# Patient Record
Sex: Female | Born: 1940
Health system: Southern US, Community
[De-identification: ages and names within clinical notes are randomized; demographics above are authoritative.]

## PROBLEM LIST (undated history)

## (undated) ENCOUNTER — Emergency Department (HOSPITAL_COMMUNITY): Admission: EM | Payer: Self-pay | Source: Home / Self Care

## (undated) DIAGNOSIS — J302 Other seasonal allergic rhinitis: Secondary | ICD-10-CM

## (undated) DIAGNOSIS — K635 Polyp of colon: Secondary | ICD-10-CM

## (undated) DIAGNOSIS — J449 Chronic obstructive pulmonary disease, unspecified: Secondary | ICD-10-CM

## (undated) DIAGNOSIS — M1990 Unspecified osteoarthritis, unspecified site: Secondary | ICD-10-CM

## (undated) HISTORY — PX: COLONOSCOPY: SHX174

---

## 2014-02-20 ENCOUNTER — Other Ambulatory Visit (HOSPITAL_COMMUNITY): Payer: Self-pay | Admitting: Internal Medicine

## 2014-02-20 ENCOUNTER — Ambulatory Visit (HOSPITAL_COMMUNITY)
Admission: RE | Admit: 2014-02-20 | Discharge: 2014-02-20 | Disposition: A | Discharge status: Home / Self Care | Payer: Medicare Other | Source: Ambulatory Visit | Attending: Internal Medicine | Admitting: Internal Medicine

## 2014-02-20 DIAGNOSIS — R05 Cough: Secondary | ICD-10-CM

## 2014-02-20 DIAGNOSIS — R059 Cough, unspecified: Secondary | ICD-10-CM

## 2014-02-20 DIAGNOSIS — J40 Bronchitis, not specified as acute or chronic: Secondary | ICD-10-CM | POA: Insufficient documentation

## 2014-02-22 ENCOUNTER — Other Ambulatory Visit (HOSPITAL_COMMUNITY): Payer: Self-pay | Admitting: Internal Medicine

## 2014-02-22 DIAGNOSIS — J40 Bronchitis, not specified as acute or chronic: Secondary | ICD-10-CM

## 2014-02-24 ENCOUNTER — Ambulatory Visit (HOSPITAL_COMMUNITY)
Admission: RE | Admit: 2014-02-24 | Discharge: 2014-02-24 | Disposition: A | Discharge status: Home / Self Care | Payer: Medicare Other | Source: Ambulatory Visit | Attending: Internal Medicine | Admitting: Internal Medicine

## 2014-02-24 ENCOUNTER — Other Ambulatory Visit (HOSPITAL_COMMUNITY): Payer: Self-pay | Admitting: Internal Medicine

## 2014-02-24 DIAGNOSIS — J40 Bronchitis, not specified as acute or chronic: Secondary | ICD-10-CM | POA: Insufficient documentation

## 2014-02-24 DIAGNOSIS — Z1231 Encounter for screening mammogram for malignant neoplasm of breast: Secondary | ICD-10-CM

## 2014-02-24 DIAGNOSIS — R918 Other nonspecific abnormal finding of lung field: Secondary | ICD-10-CM | POA: Insufficient documentation

## 2014-03-01 ENCOUNTER — Ambulatory Visit (HOSPITAL_COMMUNITY)
Admission: RE | Admit: 2014-03-01 | Discharge: 2014-03-01 | Disposition: A | Discharge status: Home / Self Care | Payer: Medicare Other | Source: Ambulatory Visit | Attending: Internal Medicine | Admitting: Internal Medicine

## 2014-03-01 DIAGNOSIS — Z1231 Encounter for screening mammogram for malignant neoplasm of breast: Secondary | ICD-10-CM | POA: Insufficient documentation

## 2014-03-09 ENCOUNTER — Other Ambulatory Visit: Payer: Self-pay

## 2014-03-09 ENCOUNTER — Telehealth: Payer: Self-pay

## 2014-03-09 DIAGNOSIS — Z1211 Encounter for screening for malignant neoplasm of colon: Secondary | ICD-10-CM

## 2014-03-09 NOTE — Telephone Encounter (Signed)
Gastroenterology Pre-Procedure Review  Request Date: 03/09/2014 Requesting Physician: Dr. Legrand Rams  PATIENT REVIEW QUESTIONS: The patient responded to the following health history questions as indicated:    1. Diabetes Melitis: no 2. Joint replacements in the past 12 months: no 3. Major health problems in the past 3 months: no 4. Has an artificial valve or MVP: no 5. Has a defibrillator: no 6. Has been advised in past to take antibiotics in advance of a procedure like teeth cleaning: no    MEDICATIONS & ALLERGIES:    Patient reports the following regarding taking any blood thinners:   Plavix? no Aspirin? no Coumadin? no  Patient confirms/reports the following medications:  Current Outpatient Prescriptions  Medication Sig Dispense Refill  . fluticasone (FLOVENT HFA) 110 MCG/ACT inhaler Inhale into the lungs 2 (two) times daily.      Marland Kitchen loratadine (CLARITIN) 10 MG tablet Take 10 mg by mouth daily.      . NON FORMULARY Pro Air as directed      . predniSONE (DELTASONE) 10 MG tablet Take 10 mg by mouth daily with breakfast. As directed ( different quantity daily)      . tiotropium (SPIRIVA) 18 MCG inhalation capsule Place 18 mcg into inhaler and inhale daily. As directed       No current facility-administered medications for this visit.    Patient confirms/reports the following allergies:  No Known Allergies  No orders of the defined types were placed in this encounter.    AUTHORIZATION INFORMATION Primary Insurance:   ID #:   Group #:  Pre-Cert / Auth required:  Pre-Cert / Auth #:   Secondary Insurance:  ID #:   Group #:  Pre-Cert / Auth required: Pre-Cert / Auth #:   SCHEDULE INFORMATION: Procedure has been scheduled as follows:  Date: 04/07/2014           Time:  8:30 AM Location: [facility] Short Stay  This Gastroenterology Pre-Precedure Review Form is being routed to the following provider(s): Barney Drain, MD

## 2014-03-14 NOTE — Telephone Encounter (Signed)
PREPOPIK-DRINK WATER TO KEEP URINE LIGHT YELLOW.  PT SHOULD DROP OFF RX 3 DAYS PRIOR TO PROCEDURE.  

## 2014-03-20 MED ORDER — SOD PICOSULFATE-MAG OX-CIT ACD 10-3.5-12 MG-GM-GM PO PACK: 1.0000 | PACK | ORAL | Status: DC

## 2014-03-20 NOTE — Telephone Encounter (Signed)
Rx sent to the pharmacy and instructions mailed to pt.  

## 2014-03-22 ENCOUNTER — Telehealth: Payer: Self-pay

## 2014-03-22 MED ORDER — PEG-KCL-NACL-NASULF-NA ASC-C 100 G PO SOLR: 1.0000 | ORAL | Status: DC

## 2014-03-22 NOTE — Telephone Encounter (Signed)
I'm also mailed the pt a copy of new instructions and a note to disregard the previous instructions. Could not reach pt by phone. Not accepting incoming calls.

## 2014-03-22 NOTE — Telephone Encounter (Signed)
Prepopik too expensive. ( $140.00) . I am sending over Movie prep and faxing the instructions for pt to the pharmacy.

## 2014-03-27 ENCOUNTER — Encounter (HOSPITAL_COMMUNITY): Payer: Self-pay | Admitting: Pharmacy Technician

## 2014-04-06 ENCOUNTER — Telehealth: Payer: Self-pay

## 2014-04-06 NOTE — Telephone Encounter (Signed)
Pt brought her prep by to get instructions on doing the prep. I went over all of the instructions and told her how to mix the prep.  She was disappointed that she could not eat today and said she has only had a very small fried green tomato earlier today. She is aware to do clear liquids today and she said her niece was taking her to the hospital tomorrow.  She said she might just reschedule but I talked to her and told her she is this close to checking her colon for colon cancer since this will be her first colonoscopy. She said she will try to go through with it. She is aware to do the enema also one hour before going to the hospital.

## 2014-04-07 ENCOUNTER — Encounter (HOSPITAL_COMMUNITY): Payer: Self-pay | Admitting: *Deleted

## 2014-04-07 ENCOUNTER — Ambulatory Visit (HOSPITAL_COMMUNITY)
Admission: RE | Admit: 2014-04-07 | Discharge: 2014-04-07 | Disposition: A | Discharge status: Home / Self Care | Payer: Medicare Other | Source: Ambulatory Visit | Attending: Gastroenterology | Admitting: Gastroenterology

## 2014-04-07 ENCOUNTER — Encounter (HOSPITAL_COMMUNITY)
Admission: RE | Disposition: A | Discharge status: Home / Self Care | Payer: Self-pay | Source: Ambulatory Visit | Attending: Gastroenterology

## 2014-04-07 DIAGNOSIS — J449 Chronic obstructive pulmonary disease, unspecified: Secondary | ICD-10-CM | POA: Insufficient documentation

## 2014-04-07 DIAGNOSIS — K620 Anal polyp: Secondary | ICD-10-CM | POA: Diagnosis not present

## 2014-04-07 DIAGNOSIS — Q438 Other specified congenital malformations of intestine: Secondary | ICD-10-CM | POA: Diagnosis not present

## 2014-04-07 DIAGNOSIS — F17200 Nicotine dependence, unspecified, uncomplicated: Secondary | ICD-10-CM | POA: Insufficient documentation

## 2014-04-07 DIAGNOSIS — K621 Rectal polyp: Secondary | ICD-10-CM | POA: Diagnosis not present

## 2014-04-07 DIAGNOSIS — IMO0002 Reserved for concepts with insufficient information to code with codable children: Secondary | ICD-10-CM | POA: Diagnosis not present

## 2014-04-07 DIAGNOSIS — D129 Benign neoplasm of anus and anal canal: Secondary | ICD-10-CM

## 2014-04-07 DIAGNOSIS — J4489 Other specified chronic obstructive pulmonary disease: Secondary | ICD-10-CM | POA: Insufficient documentation

## 2014-04-07 DIAGNOSIS — D126 Benign neoplasm of colon, unspecified: Secondary | ICD-10-CM | POA: Diagnosis not present

## 2014-04-07 DIAGNOSIS — Z1211 Encounter for screening for malignant neoplasm of colon: Secondary | ICD-10-CM | POA: Insufficient documentation

## 2014-04-07 DIAGNOSIS — D128 Benign neoplasm of rectum: Secondary | ICD-10-CM

## 2014-04-07 HISTORY — DX: Chronic obstructive pulmonary disease, unspecified: J44.9

## 2014-04-07 HISTORY — PX: COLONOSCOPY: SHX5424

## 2014-04-07 HISTORY — DX: Other seasonal allergic rhinitis: J30.2

## 2014-04-07 SURGERY — COLONOSCOPY
Anesthesia: Moderate Sedation

## 2014-04-07 MED ORDER — MIDAZOLAM HCL 5 MG/5ML IJ SOLN
INTRAMUSCULAR | Status: DC | PRN
  Administered 2014-04-07 (×2): 1 mg via INTRAVENOUS
  Administered 2014-04-07: 2 mg via INTRAVENOUS
  Administered 2014-04-07: 1 mg via INTRAVENOUS

## 2014-04-07 MED ORDER — MEPERIDINE HCL 100 MG/ML IJ SOLN
INTRAMUSCULAR | Status: DC | PRN
  Administered 2014-04-07: 25 mg via INTRAVENOUS

## 2014-04-07 MED ORDER — MEPERIDINE HCL 100 MG/ML IJ SOLN
INTRAMUSCULAR | Status: AC
  Filled 2014-04-07: qty 2

## 2014-04-07 MED ORDER — STERILE WATER FOR IRRIGATION IR SOLN
Status: DC | PRN
  Administered 2014-04-07: 08:00:00

## 2014-04-07 MED ORDER — SODIUM CHLORIDE 0.9 % IV SOLN
INTRAVENOUS | Status: DC
  Administered 2014-04-07: 08:00:00 via INTRAVENOUS

## 2014-04-07 MED ORDER — MIDAZOLAM HCL 5 MG/5ML IJ SOLN
INTRAMUSCULAR | Status: AC
  Filled 2014-04-07: qty 10

## 2014-04-07 NOTE — Telephone Encounter (Signed)
REVIEWED.  

## 2014-04-07 NOTE — H&P (Signed)
  Primary Care Physician:  Rosita Fire, MD Primary Gastroenterologist:  Dr. Oneida Alar  Pre-Procedure History & Physical: HPI:  Emily Nichols is a 73 y.o. female here for [place].  Past Medical History  Diagnosis Date  . Seasonal allergies   . COPD (chronic obstructive pulmonary disease)     Past Surgical History  Procedure Laterality Date  . No past surgeries      Prior to Admission medications   Medication Sig Start Date End Date Taking? Authorizing Provider  albuterol (PROVENTIL HFA;VENTOLIN HFA) 108 (90 BASE) MCG/ACT inhaler Inhale 1 puff into the lungs every 6 (six) hours as needed for wheezing or shortness of breath.   Yes Historical Provider, MD  fluticasone (FLONASE) 50 MCG/ACT nasal spray Place 1 spray into both nostrils daily.   Yes Historical Provider, MD  loratadine (CLARITIN) 10 MG tablet Take 10 mg by mouth daily.   Yes Historical Provider, MD  peg 3350 powder (MOVIPREP) 100 G SOLR Take 1 kit (200 g total) by mouth as directed. 03/22/14  Yes Danie Binder, MD  tiotropium (SPIRIVA) 18 MCG inhalation capsule Place 18 mcg into inhaler and inhale daily. As directed   Yes Historical Provider, MD  Sod Picosulfate-Mag Ox-Cit Acd 10-3.5-12 MG-GM-GM PACK Take 1 Container by mouth as directed. 03/20/14   Danie Binder, MD    Allergies as of 03/09/2014  . (No Known Allergies)    Family History  Problem Relation Age of Onset  . Colon cancer Neg Hx     History   Social History  . Marital Status: Single    Spouse Name: N/A    Number of Children: N/A  . Years of Education: N/A   Occupational History  . Not on file.   Social History Main Topics  . Smoking status: Current Some Day Smoker -- 10 years    Types: Cigarettes  . Smokeless tobacco: Not on file     Comment: "Sometimes I just light up one and smoke it."  . Alcohol Use: No  . Drug Use: No  . Sexual Activity: Not on file   Other Topics Concern  . Not on file   Social History Narrative  . No  narrative on file    Review of Systems: See HPI, otherwise negative ROS   Physical Exam: There were no vitals taken for this visit. General:   Alert,  pleasant and cooperative in NAD Head:  Normocephalic and atraumatic. Neck:  Supple; Lungs:  Clear throughout to auscultation.    Heart:  Regular rate and rhythm. Abdomen:  Soft, nontender and nondistended. Normal bowel sounds, without guarding, and without rebound.   Neurologic:  Alert and  oriented x4;  grossly normal neurologically.  Impression/Plan:     SCREENING  Plan:  1. TCS TODAY

## 2014-04-07 NOTE — Op Note (Signed)
[facility] [address] [place], [address]   COLONOSCOPY PROCEDURE REPORT  PATIENT: Emily Nichols, Emily Nichols  MR#: [id number] BIRTHDATE: 1941-02-24 , 72  yrs. old GENDER: Female ENDOSCOPIST: Barney Drain, MD REFERRED OI:[id number] Fanta, M.D. PROCEDURE DATE:  04/07/2014 PROCEDURE:   Colonoscopy with snare polypectomy and Colonoscopy with cold biopsy polypectomy INDICATIONS:Average risk patient for colon cancer. MEDICATIONS: Demerol 50 mg IV and Versed 5 mg IV  DESCRIPTION OF PROCEDURE:    Physical exam was performed.  Informed consent was obtained from the patient after explaining the benefits, risks, and alternatives to procedure.  The patient was connected to monitor and placed in left lateral position. Continuous oxygen was provided by nasal cannula and IV medicine administered through an indwelling cannula.  After administration of sedation and rectal exam, the patients rectum was intubated and the EC-3890Li ([id number])  colonoscope was advanced under direct visualization to the cecum.  The scope was removed slowly by carefully examining the color, texture, anatomy, and integrity mucosa on the way out.  The patient was recovered in endoscopy and discharged home in satisfactory condition.     COLON FINDINGS: SEVEN sessile polyps measuring 6-8 mm in size were found in the descending colon, sigmoid colon, and rectum.  A polypectomy was performed using snare cautery.  , Seven sessile polyps measuring 3-5 mm in size were found in the sigmoid colon and rectum.  A polypectomy was performed with cold forceps.  The LEFT colon IS redundant.  Manual abdominal counter-pressure was used to reach the cecum. NO INTERNAL HEMORRHOIDS.  PREP QUALITY: good.  CECAL W/D TIME: 30 minutes     COMPLICATIONS: None  ENDOSCOPIC IMPRESSION: 1.   14 COLON POLYPS REMOVED 3.   The LEFT COLON IS was redundant  RECOMMENDATIONS: FOLLOW A HIGH FIBER DIET.  AVOID ITEMS THAT CAUSE  BLOATING. BIOPSY RESULTS SHOULD BE BACK IN 14 DAYS. Next colonoscopy in 5-10 years.       _______________________________ Lorrin MaisBarney Drain, MD 04/07/2014 4:05 PM

## 2014-04-07 NOTE — Discharge Instructions (Signed)
You had 14 polyps removed.    FOLLOW A HIGH FIBER DIET. AVOID ITEMS THAT CAUSE BLOATING. SEE INFO BELOW.  YOUR BIOPSY RESULTS SHOULD BE BACK IN 14 DAYS.  Next colonoscopy in 5-10 years.  Colonoscopy Care After Read the instructions outlined below and refer to this sheet in the next week. These discharge instructions provide you with general information on caring for yourself after you leave the hospital. While your treatment has been planned according to the most current medical practices available, unavoidable complications occasionally occur. If you have any problems or questions after discharge, call DR. Blanca Carreon, [phone].  ACTIVITY  You may resume your regular activity, but move at a slower pace for the next 24 hours.   Take frequent rest periods for the next 24 hours.   Walking will help get rid of the air and reduce the bloated feeling in your belly (abdomen).   No driving for 24 hours (because of the medicine (anesthesia) used during the test).   You may shower.   Do not sign any important legal documents or operate any machinery for 24 hours (because of the anesthesia used during the test).    NUTRITION  Drink plenty of fluids.   You may resume your normal diet as instructed by your doctor.   Begin with a light meal and progress to your normal diet. Heavy or fried foods are harder to digest and may make you feel sick to your stomach (nauseated).   Avoid alcoholic beverages for 24 hours or as instructed.    MEDICATIONS  You may resume your normal medications.   WHAT YOU CAN EXPECT TODAY  Some feelings of bloating in the abdomen.   Passage of more gas than usual.   Spotting of blood in your stool or on the toilet paper  .  IF YOU HAD POLYPS REMOVED DURING THE COLONOSCOPY:  Eat a soft diet IF YOU HAVE NAUSEA, BLOATING, ABDOMINAL PAIN, OR VOMITING.    FINDING OUT THE RESULTS OF YOUR TEST Not all test results are available during your visit. DR. Oneida Alar  WILL CALL YOU WITHIN 7 DAYS OF YOUR PROCEDUE WITH YOUR RESULTS. Do not assume everything is normal if you have not heard from DR. Dimonique Bourdeau IN ONE WEEK, CALL HER OFFICE AT [phone].  SEEK IMMEDIATE MEDICAL ATTENTION AND CALL THE OFFICE: [phone] IF:  You have more than a spotting of blood in your stool.   Your belly is swollen (abdominal distention).   You are nauseated or vomiting.   You have a temperature over 101F.   You have abdominal pain or discomfort that is severe or gets worse throughout the day.   High-Fiber Diet A high-fiber diet changes your normal diet to include more whole grains, legumes, fruits, and vegetables. Changes in the diet involve replacing refined carbohydrates with unrefined foods. The calorie level of the diet is essentially unchanged. The Dietary Reference Intake (recommended amount) for adult males is 38 grams per day. For adult females, it is 25 grams per day. Pregnant and lactating women should consume 28 grams of fiber per day. Fiber is the intact part of a plant that is not broken down during digestion. Functional fiber is fiber that has been isolated from the plant to provide a beneficial effect in the body. PURPOSE  Increase stool bulk.   Ease and regulate bowel movements.   Lower cholesterol.  INDICATIONS THAT YOU NEED MORE FIBER  Constipation and hemorrhoids.   Uncomplicated diverticulosis (intestine condition) and irritable bowel syndrome.  Weight management.   As a protective measure against hardening of the arteries (atherosclerosis), diabetes, and cancer.   GUIDELINES FOR INCREASING FIBER IN THE DIET  Start adding fiber to the diet slowly. A gradual increase of about 5 more grams (2 slices of whole-wheat bread, 2 servings of most fruits or vegetables, or 1 bowl of high-fiber cereal) per day is best. Too rapid an increase in fiber may result in constipation, flatulence, and bloating.   Drink enough water and fluids to keep your  urine clear or pale yellow. Water, juice, or caffeine-free drinks are recommended. Not drinking enough fluid may cause constipation.   Eat a variety of high-fiber foods rather than one type of fiber.   Try to increase your intake of fiber through using high-fiber foods rather than fiber pills or supplements that contain small amounts of fiber.   The goal is to change the types of food eaten. Do not supplement your present diet with high-fiber foods, but replace foods in your present diet.  INCLUDE A VARIETY OF FIBER SOURCES  Replace refined and processed grains with whole grains, canned fruits with fresh fruits, and incorporate other fiber sources. White rice, white breads, and most bakery goods contain little or no fiber.   Brown whole-grain rice, buckwheat oats, and many fruits and vegetables are all good sources of fiber. These include: broccoli, Brussels sprouts, cabbage, cauliflower, beets, sweet potatoes, white potatoes (skin on), carrots, tomatoes, eggplant, squash, berries, fresh fruits, and dried fruits.   Cereals appear to be the richest source of fiber. Cereal fiber is found in whole grains and bran. Bran is the fiber-rich outer coat of cereal grain, which is largely removed in refining. In whole-grain cereals, the bran remains. In breakfast cereals, the largest amount of fiber is found in those with "bran" in their names. The fiber content is sometimes indicated on the label.   You may need to include additional fruits and vegetables each day.   In baking, for 1 cup white flour, you may use the following substitutions:   1 cup whole-wheat flour minus 2 tablespoons.   1/2 cup white flour plus 1/2 cup whole-wheat flour.   Polyps, Colon  A polyp is extra tissue that grows inside your body. Colon polyps grow in the large intestine. The large intestine, also called the colon, is part of your digestive system. It is a long, hollow tube at the end of your digestive tract where your body  makes and stores stool. Most polyps are not dangerous. They are benign. This means they are not cancerous. But over time, some types of polyps can turn into cancer. Polyps that are smaller than a pea are usually not harmful. But larger polyps could someday become or may already be cancerous. To be safe, doctors remove all polyps and test them.   WHO GETS POLYPS? Anyone can get polyps, but certain people are more likely than others. You may have a greater chance of getting polyps if:  You are over 50.   You have had polyps before.   Someone in your family has had polyps.   Someone in your family has had cancer of the large intestine.   Find out if someone in your family has had polyps. You may also be more likely to get polyps if you:   Eat a lot of fatty foods   Smoke   Drink alcohol   Do not exercise  Eat too much   TREATMENT  The caregiver will remove the  polyp during sigmoidoscopy or colonoscopy.    PREVENTION There is not one sure way to prevent polyps. You might be able to lower your risk of getting them if you:  Eat more fruits and vegetables and less fatty food.   Do not smoke.   Avoid alcohol.   Exercise every day.   Lose weight if you are overweight.   Eating more calcium and folate can also lower your risk of getting polyps. Some foods that are rich in calcium are milk, cheese, and broccoli. Some foods that are rich in folate are chickpeas, kidney beans, and spinach.

## 2014-04-11 ENCOUNTER — Encounter (HOSPITAL_COMMUNITY): Payer: Self-pay | Admitting: Gastroenterology

## 2014-04-19 ENCOUNTER — Telehealth: Payer: Self-pay | Admitting: Gastroenterology

## 2014-04-19 NOTE — Telephone Encounter (Signed)
Please call pt. She had simple/SERRATED adenomas removed from her colon.    FOLLOW A HIGH FIBER DIET. AVOID ITEMS THAT CAUSE BLOATING.  Next colonoscopy in 3 years. ALL SISTERS, BROTHERS, CHILDREN, AND PARENTS NEED TO HAVE A COLONOSCOPY STARTING AT THE AGE OF 40.

## 2014-04-20 NOTE — Telephone Encounter (Signed)
Pt is aware of results. 

## 2014-04-20 NOTE — Telephone Encounter (Signed)
Reminder in epic °

## 2014-07-20 ENCOUNTER — Ambulatory Visit (INDEPENDENT_AMBULATORY_CARE_PROVIDER_SITE_OTHER): Payer: Medicare Other | Admitting: Otolaryngology

## 2014-07-20 DIAGNOSIS — J310 Chronic rhinitis: Secondary | ICD-10-CM

## 2014-07-20 DIAGNOSIS — J330 Polyp of nasal cavity: Secondary | ICD-10-CM

## 2014-07-21 ENCOUNTER — Other Ambulatory Visit (INDEPENDENT_AMBULATORY_CARE_PROVIDER_SITE_OTHER): Payer: Self-pay | Admitting: Otolaryngology

## 2014-07-21 DIAGNOSIS — J320 Chronic maxillary sinusitis: Secondary | ICD-10-CM

## 2014-07-21 DIAGNOSIS — J339 Nasal polyp, unspecified: Secondary | ICD-10-CM

## 2014-07-27 ENCOUNTER — Ambulatory Visit (HOSPITAL_COMMUNITY)
Admission: RE | Admit: 2014-07-27 | Discharge: 2014-07-27 | Disposition: A | Discharge status: Home / Self Care | Payer: Medicare Other | Source: Ambulatory Visit | Attending: Otolaryngology | Admitting: Otolaryngology

## 2014-07-27 DIAGNOSIS — J320 Chronic maxillary sinusitis: Secondary | ICD-10-CM | POA: Insufficient documentation

## 2014-07-27 DIAGNOSIS — J339 Nasal polyp, unspecified: Secondary | ICD-10-CM | POA: Diagnosis not present

## 2014-08-17 ENCOUNTER — Ambulatory Visit (INDEPENDENT_AMBULATORY_CARE_PROVIDER_SITE_OTHER): Payer: Medicare Other | Admitting: Otolaryngology

## 2014-08-17 DIAGNOSIS — J321 Chronic frontal sinusitis: Secondary | ICD-10-CM

## 2014-08-17 DIAGNOSIS — J322 Chronic ethmoidal sinusitis: Secondary | ICD-10-CM

## 2014-08-17 DIAGNOSIS — J320 Chronic maxillary sinusitis: Secondary | ICD-10-CM

## 2014-08-17 DIAGNOSIS — J330 Polyp of nasal cavity: Secondary | ICD-10-CM

## 2014-09-21 DIAGNOSIS — M549 Dorsalgia, unspecified: Secondary | ICD-10-CM | POA: Diagnosis not present

## 2014-12-21 DIAGNOSIS — J45998 Other asthma: Secondary | ICD-10-CM | POA: Diagnosis not present

## 2014-12-21 DIAGNOSIS — J309 Allergic rhinitis, unspecified: Secondary | ICD-10-CM | POA: Diagnosis not present

## 2015-03-22 DIAGNOSIS — J309 Allergic rhinitis, unspecified: Secondary | ICD-10-CM | POA: Diagnosis not present

## 2015-03-22 DIAGNOSIS — J329 Chronic sinusitis, unspecified: Secondary | ICD-10-CM | POA: Diagnosis not present

## 2015-03-22 DIAGNOSIS — E780 Pure hypercholesterolemia: Secondary | ICD-10-CM | POA: Diagnosis not present

## 2015-04-16 ENCOUNTER — Other Ambulatory Visit (HOSPITAL_COMMUNITY): Payer: Self-pay | Admitting: Internal Medicine

## 2015-04-16 DIAGNOSIS — Z1231 Encounter for screening mammogram for malignant neoplasm of breast: Secondary | ICD-10-CM

## 2015-04-25 ENCOUNTER — Ambulatory Visit (HOSPITAL_COMMUNITY)
Admission: RE | Admit: 2015-04-25 | Discharge: 2015-04-25 | Disposition: A | Discharge status: Home / Self Care | Payer: Medicare Other | Source: Ambulatory Visit | Attending: Internal Medicine | Admitting: Internal Medicine

## 2015-04-25 DIAGNOSIS — Z1231 Encounter for screening mammogram for malignant neoplasm of breast: Secondary | ICD-10-CM | POA: Diagnosis present

## 2015-06-28 DIAGNOSIS — J329 Chronic sinusitis, unspecified: Secondary | ICD-10-CM | POA: Diagnosis not present

## 2015-06-28 DIAGNOSIS — J309 Allergic rhinitis, unspecified: Secondary | ICD-10-CM | POA: Diagnosis not present

## 2015-09-10 ENCOUNTER — Other Ambulatory Visit (HOSPITAL_COMMUNITY): Payer: Self-pay | Admitting: Internal Medicine

## 2015-09-10 ENCOUNTER — Ambulatory Visit (HOSPITAL_COMMUNITY)
Admission: RE | Admit: 2015-09-10 | Discharge: 2015-09-10 | Disposition: A | Discharge status: Home / Self Care | Payer: Medicare Other | Source: Ambulatory Visit | Attending: Internal Medicine | Admitting: Internal Medicine

## 2015-09-10 DIAGNOSIS — M25561 Pain in right knee: Secondary | ICD-10-CM | POA: Diagnosis present

## 2015-09-10 DIAGNOSIS — M25461 Effusion, right knee: Secondary | ICD-10-CM | POA: Insufficient documentation

## 2015-09-10 DIAGNOSIS — J309 Allergic rhinitis, unspecified: Secondary | ICD-10-CM | POA: Diagnosis not present

## 2015-09-10 DIAGNOSIS — J329 Chronic sinusitis, unspecified: Secondary | ICD-10-CM | POA: Diagnosis not present

## 2015-10-12 ENCOUNTER — Other Ambulatory Visit (HOSPITAL_COMMUNITY): Payer: Self-pay | Admitting: Internal Medicine

## 2015-10-12 ENCOUNTER — Ambulatory Visit (HOSPITAL_COMMUNITY)
Admission: RE | Admit: 2015-10-12 | Discharge: 2015-10-12 | Disposition: A | Discharge status: Home / Self Care | Payer: Medicare Other | Source: Ambulatory Visit | Attending: Internal Medicine | Admitting: Internal Medicine

## 2015-10-12 DIAGNOSIS — M19012 Primary osteoarthritis, left shoulder: Secondary | ICD-10-CM | POA: Insufficient documentation

## 2015-10-12 DIAGNOSIS — M85812 Other specified disorders of bone density and structure, left shoulder: Secondary | ICD-10-CM | POA: Diagnosis not present

## 2015-10-12 DIAGNOSIS — M25512 Pain in left shoulder: Secondary | ICD-10-CM

## 2015-10-12 DIAGNOSIS — J45998 Other asthma: Secondary | ICD-10-CM | POA: Diagnosis not present

## 2015-11-13 ENCOUNTER — Other Ambulatory Visit (HOSPITAL_COMMUNITY): Payer: Self-pay | Admitting: Internal Medicine

## 2015-11-13 DIAGNOSIS — Z780 Asymptomatic menopausal state: Secondary | ICD-10-CM

## 2015-11-13 DIAGNOSIS — M19012 Primary osteoarthritis, left shoulder: Secondary | ICD-10-CM | POA: Diagnosis not present

## 2015-11-13 DIAGNOSIS — M25512 Pain in left shoulder: Secondary | ICD-10-CM | POA: Diagnosis not present

## 2015-11-21 ENCOUNTER — Ambulatory Visit (INDEPENDENT_AMBULATORY_CARE_PROVIDER_SITE_OTHER): Payer: Medicare Other | Admitting: Orthopaedic Surgery

## 2015-11-21 VITALS — BP 110/62 | HR 74 | Temp 97.5°F | Ht 64.0 in | Wt 146.8 lb

## 2015-11-21 DIAGNOSIS — M25511 Pain in right shoulder: Secondary | ICD-10-CM

## 2015-11-21 DIAGNOSIS — M25512 Pain in left shoulder: Secondary | ICD-10-CM | POA: Diagnosis not present

## 2015-11-21 MED ORDER — ACETAMINOPHEN-CODEINE #3 300-30 MG PO TABS: ORAL_TABLET | ORAL | Status: DC

## 2015-11-21 NOTE — Progress Notes (Signed)
Subjective: My shoulders hurt    Patient ID: Emily Nichols, female    DOB: 1940-09-12, 75 y.o.   MRN: [id number]  Shoulder Pain  The pain is present in the left shoulder and right shoulder. This is a recurrent problem. The current episode started more than 1 month ago. There has been no history of extremity trauma. The problem occurs daily. The problem has been gradually worsening. The quality of the pain is described as aching and burning. The pain is at a severity of 4/10. The pain is moderate. The symptoms are aggravated by activity and cold. She has tried acetaminophen, heat, NSAIDS, oral narcotics and rest for the symptoms. The treatment provided mild relief.   She has had pain in the shoulders, more on the left for about 3 months.  It is not a result of trauma.  There is no redness or paresthesias.  She has had x-rays and has been on Tramadol with little help.  She has tried ice and heat with little help.  I have reviewed the x-rays and the x-ray report.  She has seen Dr. Legrand Rams and is referred here.  Review of Systems  Constitutional:       She does smoke.  HENT: Negative for congestion.   Respiratory: Positive for shortness of breath. Negative for cough.   Cardiovascular: Negative for chest pain and leg swelling.  Endocrine: Positive for cold intolerance.  Musculoskeletal: Positive for myalgias, joint swelling and arthralgias.  Allergic/Immunologic: Positive for environmental allergies.   Past Medical History  Diagnosis Date  . Seasonal allergies   . COPD (chronic obstructive pulmonary disease)    Past Surgical History  Procedure Laterality Date  . No past surgeries    . Colonoscopy N/A 04/07/2014    Procedure: COLONOSCOPY;  Surgeon: Danie Binder, MD;  Location: [facility];  Service: Endoscopy;  Laterality: N/A;  8:30 AM   Social History   Social History  . Marital Status: Single    Spouse Name: N/A  . Number of Children: N/A  . Years of Education: N/A    Occupational History  . Not on file.   Social History Main Topics  . Smoking status: Current Some Day Smoker -- 10 years    Types: Cigarettes  . Smokeless tobacco: Not on file     Comment: "Sometimes I just light up one and smoke it."  . Alcohol Use: No  . Drug Use: No  . Sexual Activity: Not on file   Other Topics Concern  . Not on file   Social History Narrative  . No narrative on file    BP 110/62 mmHg  Pulse 74  Temp(Src) 97.5 F (36.4 C)  Ht 5\' 4"  (1.626 m)  Wt 146 lb 12.8 oz (66.588 kg)  BMI 25.19 kg/m2     Objective:   Physical Exam  Constitutional: She is oriented to person, place, and time. She appears well-developed and well-nourished.  HENT:  Head: Normocephalic and atraumatic.  Eyes: Conjunctivae and EOM are normal. Pupils are equal, round, and reactive to light.  Neck: Normal range of motion.  Cardiovascular: Normal rate, regular rhythm and intact distal pulses.   Pulmonary/Chest: Effort normal.  Abdominal: Soft.  Musculoskeletal: She exhibits tenderness (Both shoujlders are tender, the left greater than the right.  Motion is decreased.  No redness is present.  No paresthesia is present.).       Arms: Neurological: She is alert and oriented to person, place, and time. She has normal  reflexes. She displays normal reflexes. No cranial nerve deficit. She exhibits normal muscle tone. Coordination normal.  Skin: Skin is warm and dry.  Psychiatric: She has a normal mood and affect. Her behavior is normal. Judgment and thought content normal.  Vitals reviewed.  Examination of left Upper Extremity is done.  Inspection:   Overall:  Elbow non-tender without crepitus or defects, forearm non-tender without crepitus or defects, wrist non-tender without crepitus or defects, hand non-tender.    Shoulder: with glenohumeral joint tenderness, without effusion.   Upper arm: without swelling and tenderness   Range of motion:   Overall:  Full range of motion of the  elbow, full range of motion of wrist and full range of motion in fingers.   Shoulder:  left  60 degrees forward flexion; 45 degrees abduction; 20 degrees internal rotation, 20 degrees external rotation, 10 degrees extension, 30 degrees adduction.   Stability:   Overall:  Shoulder, elbow and wrist stable   Strength and Tone:   Overall full shoulder muscles strength, full upper arm strength and normal upper arm bulk and tone.  PROCEDURE NOTE:  The patient request injection, verbal consent was obtained.  The left shoulder was prepped appropriately after time out was performed.   Sterile technique was observed and injection of 1 cc of Depo-Medrol 40 mg with several cc's of plain xylocaine. Anesthesia was provided by ethyl chloride and a 20-gauge needle was used to inject the shoulder area. A posterior approach was used.  The injection was tolerated well.  A band aid dressing was applied.  The patient was advised to apply ice later today and tomorrow to the injection sight as needed.     Assessment & Plan:  I will set up physical therapy for her.  I have changed her medicine.  Call if any problem.  Return in two weeks.

## 2015-11-22 ENCOUNTER — Ambulatory Visit (HOSPITAL_COMMUNITY)
Admission: RE | Admit: 2015-11-22 | Discharge: 2015-11-22 | Disposition: A | Discharge status: Home / Self Care | Payer: Medicare Other | Source: Ambulatory Visit | Attending: Internal Medicine | Admitting: Internal Medicine

## 2015-11-22 DIAGNOSIS — M85852 Other specified disorders of bone density and structure, left thigh: Secondary | ICD-10-CM | POA: Diagnosis not present

## 2015-11-22 DIAGNOSIS — M8588 Other specified disorders of bone density and structure, other site: Secondary | ICD-10-CM | POA: Insufficient documentation

## 2015-11-22 DIAGNOSIS — M1990 Unspecified osteoarthritis, unspecified site: Secondary | ICD-10-CM | POA: Diagnosis present

## 2015-11-22 DIAGNOSIS — M8589 Other specified disorders of bone density and structure, multiple sites: Secondary | ICD-10-CM | POA: Diagnosis not present

## 2015-11-22 DIAGNOSIS — Z780 Asymptomatic menopausal state: Secondary | ICD-10-CM

## 2015-11-28 ENCOUNTER — Ambulatory Visit (HOSPITAL_COMMUNITY): Payer: Medicare Other | Attending: Orthopaedic Surgery | Admitting: Specialist

## 2015-11-28 DIAGNOSIS — R29898 Other symptoms and signs involving the musculoskeletal system: Secondary | ICD-10-CM | POA: Diagnosis present

## 2015-11-28 DIAGNOSIS — M25512 Pain in left shoulder: Secondary | ICD-10-CM | POA: Insufficient documentation

## 2015-11-28 DIAGNOSIS — M25611 Stiffness of right shoulder, not elsewhere classified: Secondary | ICD-10-CM | POA: Insufficient documentation

## 2015-11-28 DIAGNOSIS — M25511 Pain in right shoulder: Secondary | ICD-10-CM | POA: Insufficient documentation

## 2015-11-28 DIAGNOSIS — M25612 Stiffness of left shoulder, not elsewhere classified: Secondary | ICD-10-CM | POA: Diagnosis present

## 2015-11-28 NOTE — Patient Instructions (Signed)
COMPLETE EACH EXERCISE FOR 1-3 MINUTES, 2-3 TIMES PER DAY.  SHOULDER: Flexion On Table   Place hands on table, elbows straight.slide towel across the table, leaning forward Hold __10_ seconds. Abduction (Passive)   With arm out to side, resting on table with palm down, stretch arm across table bending at your waist.. Hold __10__ seconds. Repeat ____ times. Do ____ sessions per day.  Copyright  VHI. All rights reserved.     Internal Rotation (Assistive)   Seated with elbow bent at right angle and held against side, slide arm on table surface in an inward arc. Repeat ____ times. Do ____ sessions per day. Activity: Use this motion to brush crumbs off the table.  Copyright  VHI. All rights reserved.

## 2015-11-28 NOTE — Therapy (Signed)
[place], [place], [address] Phone: [phone]   Fax:  [phone]  Occupational Therapy Evaluation  Patient Details  Name: Emily Nichols MRN: [id number] Date of Birth: [birth year] Referring Provider: Dr. Sanjuana Kava  Encounter Date: 11/28/2015      OT End of Session - 11/28/15 2134    Visit Number 1   Number of Visits 16   Date for OT Re-Evaluation 01/27/16  mini reassess on 12/28/15   Authorization Type UHC Medicare and Medicaid   Authorization Time Period before 10th visit   Authorization - Visit Number 1   Authorization - Number of Visits 10   OT Start Time 1510   OT Stop Time 1555   OT Time Calculation (min) 45 min   Activity Tolerance Patient tolerated treatment well   Behavior During Therapy [facility] for tasks assessed/performed      Past Medical History  Diagnosis Date  . Seasonal allergies   . COPD (chronic obstructive pulmonary disease)     Past Surgical History  Procedure Laterality Date  . No past surgeries    . Colonoscopy N/A 04/07/2014    Procedure: COLONOSCOPY;  Surgeon: Danie Binder, MD;  [facility];  Service: Endoscopy;  Laterality: N/A;  8:30 AM    There were no vitals filed for this visit.      Subjective Assessment - 11/28/15 2122    Subjective  S:  I have arthritis, its some better and I still have pain   Pertinent History Emily Nichols has been experiencing arthritis pain for several years in both shoulders.  She consulted with Dr. Luna Glasgow and has been referred to occupational therapy for evaluation and treatment.   Patient Stated Goals I want to get rid of the pain   Currently in Pain? Yes   Pain Score 3    Pain Location Shoulder   Pain Orientation Right;Left   Pain Descriptors / Indicators Aching   Pain Type Chronic pain   Pain Onset More than a month ago   Pain Frequency Constant   Aggravating Factors  weather, use   Pain Relieving Factors rest, weather    Effect of Pain on Daily Activities decreased use of arms with functional activities            [facility] OT Assessment - 11/28/15 0001    Assessment   Diagnosis Bilateral Shoulder Pain   Referring Provider Dr. Sanjuana Kava   Onset Date --  chronic   Prior Therapy n/a   Precautions   Precautions None   Restrictions   Weight Bearing Restrictions No   Balance Screen   Has the patient fallen in the past 6 months No   Has the patient had a decrease in activity level because of a fear of falling?  No   Is the patient reluctant to leave their home because of a fear of falling?  No   Home  Environment   Lives With Family   Prior Function   Level of Independence Independent   Vocation Retired   Leisure shopping   ADL   ADL comments difficult to don shirts and bra, difficult to reach overhead   Written Expression   Dominant Hand Right   Vision - History   Baseline Vision Wears glasses all the time   Cognition   Overall Cognitive Status Within Functional Limits for tasks assessed   Observation/Other Assessments   Focus on Therapeutic Outcomes (FOTO)  68.77/100  Sensation   Light Touch Appears Intact   Coordination   Gross Motor Movements are Fluid and Coordinated Yes   Fine Motor Movements are Fluid and Coordinated Yes   Palpation   Palpation comment moderate restriction in bilateral shoulder and scapular regions   AROM   Overall AROM Comments assessed in seated, external rotation and internal rotation with shoulder adducted,    Right/Left Shoulder Right;Left   Right Shoulder Flexion 90 Degrees   Right Shoulder ABduction 90 Degrees   Right Shoulder Internal Rotation 78 Degrees   Right Shoulder External Rotation 65 Degrees   Left Shoulder Flexion 72 Degrees   Left Shoulder ABduction 76 Degrees   Left Shoulder Internal Rotation 90 Degrees   Left Shoulder External Rotation 65 Degrees   PROM   Overall PROM Comments WFL in supine   Strength   Overall Strength Comments  assessed in seated, MMT caused increased pain in bilateral shoulders    Strength Assessment Site Shoulder   Right/Left Shoulder Right;Left   Right Shoulder Flexion 3+/5   Right Shoulder ABduction 3+/5   Right Shoulder Internal Rotation 3+/5   Right Shoulder External Rotation 3+/5   Left Shoulder Flexion 3+/5   Left Shoulder ABduction 3+/5   Left Shoulder Internal Rotation 3+/5   Left Shoulder External Rotation 3+/5                  OT Treatments/Exercises (OP) - 11/28/15 0001    Exercises   Exercises Shoulder   Shoulder Exercises: Supine   External Rotation Both;PROM;5 reps   Internal Rotation Both;PROM;5 reps   Flexion Both;5 reps;PROM   ABduction Both;PROM;5 reps   Manual Therapy   Manual Therapy Myofascial release   Manual therapy comments manual therapy completed seperately from all other treatment interventions this date   Myofascial Release Myofascial release to bilateral upper arm, scapular, and shoulder regions to decrease pain and restrictions and improve pain free mobility in bilateral shoulders                OT Education - 11/28/15 2125    Education provided Yes   Education Details towel slides for bilateral shoulders    Person(s) Educated Patient   Methods Explanation;Demonstration;Handout   Comprehension Verbal cues required;Returned demonstration;Verbalized understanding          OT Short Term Goals - 11/28/15 2140    OT SHORT TERM GOAL #1   Title Patient will be educated on a HEP for improved A/ROM in bilateral shoulders for improved independence with dressing tasks.   Time 4   Period Weeks   Status New   OT SHORT TERM GOAL #2   Title Patient will improve bilateral shoulder A/ROM by 20 degrees for improved ability to don and doff shirts.    Time 4   Period Weeks   Status New   OT SHORT TERM GOAL #3   Title Patient will improve bilateral shoulder strength to 4-/5 for increased ability to lift pots and pans when cooking.    Time 4    Period Weeks   Status New   OT SHORT TERM GOAL #4   Title Patient will decrease pain in bilateral shoulders to 2/10 with functional tasks.   Time 4   Period Weeks   Status New   OT SHORT TERM GOAL #5   Title Patient will decrease fascial restrictions to min-mod in her shoulders in order to have increased mobility with functional tasks    Time 4   Period Weeks  Status New           OT Long Term Goals - 11/28/15 2144    OT LONG TERM GOAL #1   Title Patient will return to prior level of independence with all B/IADLS and leisure tasks, using right hand as dominant.    Time 8   Period Weeks   Status New   OT LONG TERM GOAL #2   Title Patient will improve bilateral shoulder A/ROM to [facility] for improved independence placing dishes into overhead cabinets.   Time 8   Period Weeks   Status New   OT LONG TERM GOAL #3   Title Patient will improve bilateral shoulder strength to 4/5 for increased ability to carry shopping bags.    Time 8   Period Weeks   Status New   OT LONG TERM GOAL #4   Title Patient will decrease pain in her shoulders to 1/10 or better when completing daily tasks.   Time 8   Period Weeks   Status New   OT LONG TERM GOAL #5   Title Patient will have min fasical and muscle tightness in her shoulders for improved functional mobility needed for daily tasks.    Time 8   Period Weeks               Plan - 11/28/15 2135    Clinical Impression Statement A:  Patient is a 75 year old female with past medical history that includes COPD and arthritis.  Patient has been experiencing increased pain and decreased movement in her shoulders for over a year.  These deficits are causing her to require assistance with donning her shirt and bra from her brother, with whom she lives.  She is not able to complete IADLS, such as cooking and cleaning with as much independence as she would like.    Rehab Potential Good   OT Frequency 2x / week   OT Duration 8 weeks   OT  Treatment/Interventions Self-care/ADL training;Cryotherapy;Ultrasound;Moist Heat;Electrical Stimulation;DME and/or AE instruction;Neuromuscular education;Therapeutic exercise;Manual Therapy;Passive range of motion;Therapeutic exercises;Therapeutic activities;Patient/family education   Plan Skilled OT intervention to improve A/ROM and strength and decrease pain and restrictions in order to return to prior level of function with daily tasks.  Treatment plan:  Myofascial release and manual therapy, P/ROM, AA/ROM, scapular A/ROM, ball stretches, progress as tolerated.    Consulted and Agree with Plan of Care Patient      Patient will benefit from skilled therapeutic intervention in order to improve the following deficits and impairments:  Decreased range of motion, Decreased strength, Increased fascial restricitons, Increased muscle spasms, Impaired flexibility, Pain  Visit Diagnosis: Pain in left shoulder - Plan: Ot plan of care cert/re-cert  Pain in right shoulder - Plan: Ot plan of care cert/re-cert  Other symptoms and signs involving the musculoskeletal system - Plan: Ot plan of care cert/re-cert  Stiffness of left shoulder, not elsewhere classified - Plan: Ot plan of care cert/re-cert  Stiffness of right shoulder, not elsewhere classified - Plan: Ot plan of care cert/re-cert      G-Codes - [id number]    Functional Assessment Tool Used FOTO 68.77, 31% impaired   Functional Limitation Self care   Self Care Current Status [id number]) At least 20 percent but less than 40 percent impaired, limited or restricted   Self Care Goal Status [id number]) At least 1 percent but less than 20 percent impaired, limited or restricted      Problem List There are no active problems to  display for this patient.   Vangie Bicker, OTR/L [phone]  11/28/2015, 9:54 PM  Emily Nichols [address] [place], [place], [address] Phone: [phone]   Fax:   [phone]  Name: Emily Nichols MRN: [id number] Date of Birth: Jan 16, 1941

## 2015-12-05 ENCOUNTER — Encounter: Payer: Self-pay | Admitting: Orthopaedic Surgery

## 2015-12-05 ENCOUNTER — Encounter (HOSPITAL_COMMUNITY): Payer: Self-pay

## 2015-12-05 ENCOUNTER — Ambulatory Visit (HOSPITAL_COMMUNITY): Payer: Medicare Other

## 2015-12-05 ENCOUNTER — Ambulatory Visit (INDEPENDENT_AMBULATORY_CARE_PROVIDER_SITE_OTHER): Payer: Medicare Other | Admitting: Orthopaedic Surgery

## 2015-12-05 VITALS — BP 118/71 | HR 66 | Temp 97.5°F | Resp 16 | Ht 64.0 in | Wt 144.0 lb

## 2015-12-05 DIAGNOSIS — M25511 Pain in right shoulder: Secondary | ICD-10-CM | POA: Diagnosis not present

## 2015-12-05 DIAGNOSIS — M25512 Pain in left shoulder: Secondary | ICD-10-CM | POA: Diagnosis not present

## 2015-12-05 DIAGNOSIS — M25612 Stiffness of left shoulder, not elsewhere classified: Secondary | ICD-10-CM | POA: Diagnosis not present

## 2015-12-05 DIAGNOSIS — M25611 Stiffness of right shoulder, not elsewhere classified: Secondary | ICD-10-CM | POA: Diagnosis not present

## 2015-12-05 DIAGNOSIS — R29898 Other symptoms and signs involving the musculoskeletal system: Secondary | ICD-10-CM | POA: Diagnosis not present

## 2015-12-05 DIAGNOSIS — R634 Abnormal weight loss: Secondary | ICD-10-CM | POA: Diagnosis not present

## 2015-12-05 NOTE — Therapy (Signed)
[place], [place], [address] Phone: [phone]   Fax:  [phone]  Occupational Therapy Treatment  Patient Details  Name: Emily Nichols MRN: [id number] Date of Birth: 1941-01-31 Referring Provider: Dr. Sanjuana Kava  Encounter Date: 12/05/2015      OT End of Session - 12/05/15 1615    Visit Number 2   Number of Visits 16   Date for OT Re-Evaluation 01/27/16  mini reassess on 12/28/15   Authorization Type UHC Medicare and Medicaid   Authorization Time Period before 10th visit   Authorization - Visit Number 2   Authorization - Number of Visits 10   OT Start Time 1440  patient arrived late   OT Stop Time 1520   OT Time Calculation (min) 40 min   Activity Tolerance Patient tolerated treatment well   Behavior During Therapy [facility] for tasks assessed/performed      Past Medical History  Diagnosis Date  . Seasonal allergies   . COPD (chronic obstructive pulmonary disease) [facility])     Past Surgical History  Procedure Laterality Date  . No past surgeries    . Colonoscopy N/A 04/07/2014    Procedure: COLONOSCOPY;  Surgeon: Danie Binder, MD;  [facility];  Service: Endoscopy;  Laterality: N/A;  8:30 AM    There were no vitals filed for this visit.      Subjective Assessment - 12/05/15 1455    Subjective  S: They (shoulders) don't hurt.   Currently in Pain? No/denies            [facility] OT Assessment - 12/05/15 1456    Assessment   Diagnosis Bilateral Shoulder Pain   Precautions   Precautions None                  OT Treatments/Exercises (OP) - 12/05/15 1456    Exercises   Exercises Shoulder   Shoulder Exercises: Supine   Protraction Both;PROM;10 reps   Horizontal ABduction Both;PROM;5 reps   External Rotation Both;PROM;5 reps   Internal Rotation Both;PROM;5 reps   Flexion Both;PROM;5 reps   ABduction Both;PROM;5 reps   Shoulder Exercises: Seated   Elevation AROM;10  reps   Extension AROM;10 reps   Row AROM;10 reps   Manual Therapy   Manual Therapy Myofascial release   Manual therapy comments manual therapy completed seperately from all other treatment interventions this date   Myofascial Release Myofascial release to bilateral upper arm, scapular, and shoulder regions to decrease pain and restrictions and improve pain free mobility in bilateral shoulders                 OT Education - 12/05/15 1620    Education provided Yes   Education Details Pt given evaluation hand out and goals were reviewed. Dicussed frequency of therapy (see clinical assessment statement)   Person(s) Educated Patient   Methods Explanation;Handout   Comprehension Verbalized understanding          OT Short Term Goals - 12/05/15 1615    OT SHORT TERM GOAL #1   Title Patient will be educated on a HEP for improved A/ROM in bilateral shoulders for improved independence with dressing tasks.   Time 4   Period Weeks   Status On-going   OT SHORT TERM GOAL #2   Title Patient will improve bilateral shoulder A/ROM by 20 degrees for improved ability to don and doff shirts.    Time 4   Period Weeks  Status On-going   OT SHORT TERM GOAL #3   Title Patient will improve bilateral shoulder strength to 4-/5 for increased ability to lift pots and pans when cooking.    Time 4   Period Weeks   Status On-going   OT SHORT TERM GOAL #4   Title Patient will decrease pain in bilateral shoulders to 2/10 with functional tasks.   Time 4   Period Weeks   Status On-going   OT SHORT TERM GOAL #5   Title Patient will decrease fascial restrictions to min-mod in her shoulders in order to have increased mobility with functional tasks    Time 4   Period Weeks   Status On-going           OT Long Term Goals - 12/05/15 1615    OT LONG TERM GOAL #1   Title Patient will return to prior level of independence with all B/IADLS and leisure tasks, using right hand as dominant.    Time 8    Period Weeks   Status On-going   OT LONG TERM GOAL #2   Title Patient will improve bilateral shoulder A/ROM to [facility] for improved independence placing dishes into overhead cabinets.   Time 8   Period Weeks   Status On-going   OT LONG TERM GOAL #3   Title Patient will improve bilateral shoulder strength to 4/5 for increased ability to carry shopping bags.    Time 8   Period Weeks   Status On-going   OT LONG TERM GOAL #4   Title Patient will decrease pain in her shoulders to 1/10 or better when completing daily tasks.   Time 8   Period Weeks   Status On-going   OT LONG TERM GOAL #5   Title Patient will have min fasical and muscle tightness in her shoulders for improved functional mobility needed for daily tasks.    Time 8   Period Weeks   Status On-going               Plan - 12/05/15 1616    Clinical Impression Statement A: Patient concerned with copay amount of $40 with 2x a week. t requested to decreased frequency. Therapist also informed patient that she can be billed for the therapy copay instead of paying it at each visit. Appointments were decreased to once a week and patient was educated to make sure she is vigilant with them. Pt verbalized understanding. Initiated myofascial release to bilateral shoulders. Minimal fascial restrictions noted this date. Completed manual stretching and therapy ball exercises. VC for form and technique.   Plan P: Complete Myofascial release if needed. Progress to AA/ROM supine when able. Add wall wash.       Patient will benefit from skilled therapeutic intervention in order to improve the following deficits and impairments:  Decreased range of motion, Decreased strength, Increased fascial restricitons, Increased muscle spasms, Impaired flexibility, Pain  Visit Diagnosis: Pain in left shoulder  Pain in right shoulder  Other symptoms and signs involving the musculoskeletal system  Stiffness of left shoulder, not elsewhere  classified  Stiffness of right shoulder, not elsewhere classified    Problem List There are no active problems to display for this patient.   Marijo Conception OTA student 12/05/2015, 4:22 PM  [place] [address] [place], [place], [address] Phone: [phone]   Fax:  [phone]  Name: Emily Nichols MRN: [id number] Date of Birth: Jul 18, 1941  Ailene Ravel, OTR/L,CBIS  [phone]  This entire session was guided, instructed, and directly supervised by Ailene Ravel, OTR/L, CBIS.

## 2015-12-05 NOTE — Patient Instructions (Signed)
Continue therapy

## 2015-12-05 NOTE — Progress Notes (Signed)
Patient [name] Emily Nichols, female DOB:[birth year], 75 y.o. [id number]  Chief Complaint  Patient presents with  . Follow-up     LEFT SHOUDLER PAIN "BETTER"    HPI  Emily Nichols is a 75 y.o. female who has had bilateral shoulder pain.   She has had more pain in the left shoulder.  She is going to PT/OT and doing very well.  She is much improved.  She has less pain.  She is doing her exercises at home.  She is taking her medicine. She has no new trauma.  HPI  She has a new problem.  She is losing weight.  She has last two pounds since last here.  She has no bowel problems, she has no tarry stools, she has no nausea.  She says she does not feel like eating.  She does not like to cook.  She has no other pains.  I have suggested she try Boost or Ensure with each meal or doing "snack" time.  She is agreeable to do this.  I will check her weight on return.  She is still smoking.  She does not want to quit.  I have asked her to at least cut back. She will consider.  Body mass index is 24.71 kg/(m^2).  Review of Systems  Constitutional:       She does smoke.  HENT: Negative for congestion.   Respiratory: Positive for shortness of breath. Negative for cough.   Cardiovascular: Negative for chest pain and leg swelling.  Endocrine: Positive for cold intolerance.  Musculoskeletal: Positive for myalgias, joint swelling and arthralgias.  Allergic/Immunologic: Positive for environmental allergies.    Past Medical History  Diagnosis Date  . Seasonal allergies   . COPD (chronic obstructive pulmonary disease) [facility])     Past Surgical History  Procedure Laterality Date  . No past surgeries    . Colonoscopy N/A 04/07/2014    Procedure: COLONOSCOPY;  Surgeon: Danie Binder, MD;  [facility];  Service: Endoscopy;  Laterality: N/A;  8:30 AM    Family History  Problem Relation Age of Onset  . Colon cancer Neg Hx     Social History Social History  Substance Use Topics  .  Smoking status: Current Some Day Smoker -- 10 years    Types: Cigarettes  . Smokeless tobacco: None     Comment: "Sometimes I just light up one and smoke it."  . Alcohol Use: No    No Known Allergies  Current Outpatient Prescriptions  Medication Sig Dispense Refill  . acetaminophen-codeine (TYLENOL #3) 300-30 MG tablet One tablet every four hours as needed for pain.  Must last TEN days. 40 tablet 2  . albuterol (PROVENTIL HFA;VENTOLIN HFA) 108 (90 BASE) MCG/ACT inhaler Inhale 1 puff into the lungs every 6 (six) hours as needed for wheezing or shortness of breath. Reported on 11/21/2015    . fluticasone (FLONASE) 50 MCG/ACT nasal spray Place into both nostrils daily.    . Fluticasone-Salmeterol (ADVAIR) 100-50 MCG/DOSE AEPB Inhale 1 puff into the lungs 2 (two) times daily.    Marland Kitchen loratadine (CLARITIN) 10 MG tablet Take 10 mg by mouth daily. Reported on 11/28/2015    . montelukast (SINGULAIR) 10 MG tablet Take 10 mg by mouth at bedtime. Reported on 11/28/2015    . Sod Picosulfate-Mag Ox-Cit Acd 10-3.5-12 MG-GM-GM PACK Take 1 Container by mouth as directed. 1 each 0  . tiotropium (SPIRIVA) 18 MCG inhalation capsule Place 18 mcg into inhaler and inhale daily.  Reported on 11/28/2015     No current facility-administered medications for this visit.     Physical Exam  Blood pressure 118/71, pulse 66, temperature 97.5 F (36.4 C), resp. rate 16, height 5\' 4"  (1.626 m), weight 144 lb (65.318 kg).  Constitutional: overall normal hygiene, normal nutrition, well developed, normal grooming, normal body habitus. Assistive device:none  Musculoskeletal: gait and station Limp none, muscle tone and strength are normal, no tremors or atrophy is present.  .  Neurological: coordination overall normal.  Deep tendon reflex/nerve stretch intact.  Sensation normal.  Cranial nerves II-XII intact.   Skin:   normal overall no scars, lesions, ulcers or rashes. No psoriasis.  Psychiatric: Alert and oriented x 3.   Recent memory intact, remote memory unclear.  Normal mood and affect. Well groomed.  Good eye contact.  Cardiovascular: overall no swelling, no varicosities, no edema bilaterally, normal temperatures of the legs and arms, no clubbing, cyanosis and good capillary refill.  Lymphatic: palpation is normal.  Examination of left Upper Extremity is done.  Inspection:   Overall:  Elbow non-tender without crepitus or defects, forearm non-tender without crepitus or defects, wrist non-tender without crepitus or defects, hand non-tender.    Shoulder: with glenohumeral joint tenderness, without effusion.   Upper arm: without swelling and tenderness   Range of motion:   Overall:  Full range of motion of the elbow, full range of motion of wrist and full range of motion in fingers.   Shoulder:  left  100 degrees forward flexion; 75 degrees abduction; 25 degrees internal rotation, 25 degrees external rotation, 15 degrees extension, 35 degrees adduction.   Stability:   Overall:  Shoulder, elbow and wrist stable   Strength and Tone:   Overall full shoulder muscles strength, full upper arm strength and normal upper arm bulk and tone.  Examination of right Upper Extremity is done.  Inspection:   Overall:  Elbow non-tender without crepitus or defects, forearm non-tender without crepitus or defects, wrist non-tender without crepitus or defects, hand non-tender.    Shoulder: with glenohumeral joint tenderness, without effusion.   Upper arm: without swelling and tenderness   Range of motion:   Overall:  Full range of motion of the elbow, full range of motion of wrist and full range of motion in fingers.   Shoulder:  right  145 degrees forward flexion; 100 degrees abduction; 30 degrees internal rotation, 30 degrees external rotation, 15 degrees extension, 40 degrees adduction.   Stability:   Overall:  Shoulder, elbow and wrist stable   Strength and Tone:   Overall full shoulder muscles strength, full upper  arm strength and normal upper arm bulk and tone.  The patient has been educated about the nature of the problem(s) and counseled on treatment options.  The patient appeared to understand what I have discussed and is in agreement with it.  Encounter Diagnosis  Name Primary?  . Left shoulder pain Yes    PLAN Call if any problems.  Precautions discussed.  Continue current medications.   Return to clinic 1 month   Continue therapy.  Take Boost or Ensure.

## 2015-12-07 ENCOUNTER — Encounter (HOSPITAL_COMMUNITY): Payer: Medicare Other | Admitting: Occupational Therapy

## 2015-12-10 ENCOUNTER — Telehealth (HOSPITAL_COMMUNITY): Payer: Self-pay

## 2015-12-10 ENCOUNTER — Ambulatory Visit (HOSPITAL_COMMUNITY): Payer: Medicare Other

## 2015-12-10 NOTE — Telephone Encounter (Signed)
Called patient and left message regarding no show today. Reminded her of her of next appointment. If she is unable to make next appointment she was asked to call and let us know.   Ailene Ravel, OTR/L,CBIS  [phone]

## 2015-12-12 ENCOUNTER — Encounter (HOSPITAL_COMMUNITY): Payer: Medicare Other | Admitting: Occupational Therapy

## 2015-12-17 ENCOUNTER — Ambulatory Visit (HOSPITAL_COMMUNITY): Payer: Medicare Other | Attending: Orthopaedic Surgery | Admitting: Specialist

## 2015-12-17 DIAGNOSIS — M25511 Pain in right shoulder: Secondary | ICD-10-CM | POA: Diagnosis present

## 2015-12-17 DIAGNOSIS — M25611 Stiffness of right shoulder, not elsewhere classified: Secondary | ICD-10-CM | POA: Diagnosis present

## 2015-12-17 DIAGNOSIS — R29898 Other symptoms and signs involving the musculoskeletal system: Secondary | ICD-10-CM | POA: Diagnosis present

## 2015-12-17 DIAGNOSIS — M25512 Pain in left shoulder: Secondary | ICD-10-CM | POA: Diagnosis not present

## 2015-12-17 DIAGNOSIS — M25612 Stiffness of left shoulder, not elsewhere classified: Secondary | ICD-10-CM | POA: Insufficient documentation

## 2015-12-17 NOTE — Therapy (Signed)
[place], [place], [address] Phone: [phone]   Fax:  [phone]  Occupational Therapy Treatment  Patient Details  Name: Emily Nichols MRN: [id number] Date of Birth: Jun 11, 1941 Referring Provider: Dr. Sanjuana Kava  Encounter Date: 12/17/2015      OT End of Session - 12/17/15 1604    Visit Number 3   Number of Visits 16   Date for OT Re-Evaluation 01/27/16  mini reassess on 12/28/15   Authorization Type UHC Medicare and Medicaid   Authorization Time Period before 10th visit   Authorization - Visit Number 3   Authorization - Number of Visits 10   OT Start Time 8325   OT Stop Time 1600   OT Time Calculation (min) 43 min   Activity Tolerance Patient tolerated treatment well   Behavior During Therapy [facility] for tasks assessed/performed      Past Medical History  Diagnosis Date  . Seasonal allergies   . COPD (chronic obstructive pulmonary disease) [facility])     Past Surgical History  Procedure Laterality Date  . No past surgeries    . Colonoscopy N/A 04/07/2014    Procedure: COLONOSCOPY;  Surgeon: Danie Binder, MD;  [facility];  Service: Endoscopy;  Laterality: N/A;  8:30 AM    There were no vitals filed for this visit.      Subjective Assessment - 12/17/15 1603    Subjective  S:  The left one is worse.  I like doing the exercises at home.    Currently in Pain? Yes   Pain Score 4    Pain Location Shoulder   Pain Orientation Left   Pain Descriptors / Indicators Aching   Pain Type Chronic pain   Pain Onset More than a month ago   Pain Frequency Constant   Aggravating Factors  weather use   Pain Relieving Factors rest weather   Effect of Pain on Daily Activities decreased use of arm wtih functional activities             [facility] OT Assessment - 12/17/15 0001    Assessment   Diagnosis Bilateral Shoulder Pain                  OT Treatments/Exercises (OP) - 12/17/15 0001     Exercises   Exercises Shoulder   Shoulder Exercises: Supine   Protraction PROM;AAROM;5 reps   Horizontal ABduction PROM;5 reps   External Rotation PROM;AAROM;5 reps   Internal Rotation PROM;AAROM;5 reps   Flexion PROM;AAROM;5 reps   ABduction PROM;AAROM;5 reps   Shoulder Exercises: Seated   Elevation AROM;10 reps   Shoulder Exercises: Standing   Protraction AAROM;5 reps   Horizontal ABduction AAROM;5 reps   External Rotation AAROM;5 reps   Internal Rotation AAROM;5 reps   Flexion AAROM;5 reps   ABduction AAROM;5 reps   Shoulder Exercises: Therapy Ball   Flexion 15 reps   ABduction 15 reps   Manual Therapy   Manual Therapy Myofascial release   Manual therapy comments manual therapy completed seperately from all other treatment interventions this date   Myofascial Release Myofascial release to bilateral upper arm, scapular, and shoulder regions to decrease pain and restrictions and improve pain free mobility in bilateral shoulders                 OT Education - 12/17/15 1604    Education provided Yes   Education Details educated on AA/ROM for shoulders in standing  Person(s) Educated Patient   Methods Explanation;Handout   Comprehension Verbalized understanding          OT Short Term Goals - 12/05/15 1615    OT SHORT TERM GOAL #1   Title Patient will be educated on a HEP for improved A/ROM in bilateral shoulders for improved independence with dressing tasks.   Time 4   Period Weeks   Status On-going   OT SHORT TERM GOAL #2   Title Patient will improve bilateral shoulder A/ROM by 20 degrees for improved ability to don and doff shirts.    Time 4   Period Weeks   Status On-going   OT SHORT TERM GOAL #3   Title Patient will improve bilateral shoulder strength to 4-/5 for increased ability to lift pots and pans when cooking.    Time 4   Period Weeks   Status On-going   OT SHORT TERM GOAL #4   Title Patient will decrease pain in bilateral shoulders to  2/10 with functional tasks.   Time 4   Period Weeks   Status On-going   OT SHORT TERM GOAL #5   Title Patient will decrease fascial restrictions to min-mod in her shoulders in order to have increased mobility with functional tasks    Time 4   Period Weeks   Status On-going           OT Long Term Goals - 12/05/15 1615    OT LONG TERM GOAL #1   Title Patient will return to prior level of independence with all B/IADLS and leisure tasks, using right hand as dominant.    Time 8   Period Weeks   Status On-going   OT LONG TERM GOAL #2   Title Patient will improve bilateral shoulder A/ROM to [facility] for improved independence placing dishes into overhead cabinets.   Time 8   Period Weeks   Status On-going   OT LONG TERM GOAL #3   Title Patient will improve bilateral shoulder strength to 4/5 for increased ability to carry shopping bags.    Time 8   Period Weeks   Status On-going   OT LONG TERM GOAL #4   Title Patient will decrease pain in her shoulders to 1/10 or better when completing daily tasks.   Time 8   Period Weeks   Status On-going   OT LONG TERM GOAL #5   Title Patient will have min fasical and muscle tightness in her shoulders for improved functional mobility needed for daily tasks.    Time 8   Period Weeks   Status On-going               Plan - 12/17/15 1604    Clinical Impression Statement A:  Updated HEP with AA/ROM, patient demonstrates better form in seated/standing vs supine.  Patient has signs and symtoms of rotator cuff tendonitis during therapeutic exercises    Plan P:  MFR to left shoulder as needed, increase repetitions with AA/ROM, add wall wash, thumbtacks and prot/ret//elev/dep for improved scapular stability needed for reaching overhead.    Consulted and Agree with Plan of Care Patient      Patient will benefit from skilled therapeutic intervention in order to improve the following deficits and impairments:  Decreased range of motion, Decreased  strength, Increased fascial restricitons, Increased muscle spasms, Impaired flexibility, Pain  Visit Diagnosis: Pain in left shoulder  Other symptoms and signs involving the musculoskeletal system  Stiffness of left shoulder, not elsewhere classified  Problem List There are no active problems to display for this patient.   Vangie Bicker, OTR/L [phone]  12/17/2015, 4:07 PM  [facility] [address] [place], [place], [address] Phone: [phone]   Fax:  [phone]  Name: Emily Nichols MRN: [id number] Date of Birth: [birth year]

## 2015-12-17 NOTE — Patient Instructions (Signed)
Perform each exercise ____10____ reps. 2-3x days.   Protraction - STANDING  Start by holding a wand or cane at chest height.  Next, slowly push the wand outwards in front of your body so that your elbows become fully straightened. Then, return to the original position.     Shoulder FLEXION - STANDING - PALMS UP  In the standing position, hold a wand/cane with both arms, palms up on both sides. Raise up the wand/cane allowing your unaffected arm to perform most of the effort. Your affected arm should be partially relaxed.      Internal/External ROTATION - STANDING  In the standing position, hold a wand/cane with both hands keeping your elbows bent. Move your arms and wand/cane to one side.  Your affected arm should be partially relaxed while your unaffected arm performs most of the effort.       Shoulder ABDUCTION - STANDING  While holding a wand/cane palm face up on the injured side and palm face down on the uninjured side, slowly raise up your injured arm to the side.                     Horizontal Abduction/Adduction      Straight arms holding cane at shoulder height, bring cane to right, center, left. Repeat starting to left.   Copyright  VHI. All rights reserved.       

## 2015-12-19 ENCOUNTER — Encounter (HOSPITAL_COMMUNITY): Payer: Medicare Other | Admitting: Specialist

## 2015-12-21 ENCOUNTER — Emergency Department (HOSPITAL_COMMUNITY): Payer: Medicare Other

## 2015-12-21 ENCOUNTER — Encounter (HOSPITAL_COMMUNITY): Payer: Self-pay | Admitting: *Deleted

## 2015-12-21 ENCOUNTER — Emergency Department (HOSPITAL_COMMUNITY)
Admission: EM | Admit: 2015-12-21 | Discharge: 2015-12-21 | Disposition: A | Discharge status: Home / Self Care | Payer: Medicare Other | Attending: Emergency Medicine | Admitting: Emergency Medicine

## 2015-12-21 DIAGNOSIS — K5641 Fecal impaction: Secondary | ICD-10-CM | POA: Insufficient documentation

## 2015-12-21 DIAGNOSIS — J449 Chronic obstructive pulmonary disease, unspecified: Secondary | ICD-10-CM | POA: Diagnosis not present

## 2015-12-21 DIAGNOSIS — F17210 Nicotine dependence, cigarettes, uncomplicated: Secondary | ICD-10-CM | POA: Diagnosis not present

## 2015-12-21 DIAGNOSIS — R140 Abdominal distension (gaseous): Secondary | ICD-10-CM | POA: Diagnosis not present

## 2015-12-21 MED ORDER — FLEET ENEMA 7-19 GM/118ML RE ENEM: 1.0000 | ENEMA | Freq: Once | RECTAL | Status: DC

## 2015-12-21 NOTE — ED Notes (Addendum)
Pt states she hasn't had a BM since Wednesday, May 3rd. Pt states she has taken 2 laxatives today without any relief (milk of mag this mornig and magnesium citrate at 6pm). Pt denies any pain or n/v.

## 2015-12-21 NOTE — Discharge Instructions (Signed)
Fecal Impaction A fecal impaction happens when there is a large, firm amount of stool (or feces) that cannot be passed. The impacted stool is usually in the rectum, which is the lowest part of the large bowel. The impacted stool can block the colon and cause significant problems. CAUSES  The longer stool stays in the rectum, the harder it gets. Anything that slows down your bowel movements can lead to fecal impaction, such as:  Constipation. This can be a long-standing (chronic) problem or can happen suddenly (acute).  Painful conditions of the rectum, such as hemorrhoids or anal fissures. The pain of these conditions can make you try to avoid having bowel movements.  Narcotic pain-relieving medicines, such as methadone, morphine, or codeine.  Not drinking enough fluids.  Inactivity and bed rest over long periods of time.  Diseases of the brain or nervous system that damage the nerves controlling the muscles of the intestines. SIGNS AND SYMPTOMS   Lack of normal bowel movements or changes in bowel patterns.  Sense of fullness in the rectum but unable to pass stool.  Pain or cramps in the abdominal area (often after meals).  Thin, watery discharge from the rectum. DIAGNOSIS  Your health care provider may suspect that you have a fecal impaction based on your symptoms and a physical exam. This will include an exam of your rectum. Sometimes X-rays or lab testing may be needed to confirm the diagnosis and to be sure there are no other problems.  TREATMENT   Initially an impaction can be removed manually. Using a gloved finger, your health care provider can remove hard stool from your rectum.  Medicine is sometimes needed. A suppository or enema can be given in the rectum to soften the stool, which can stimulate a bowel movement. Medicines can also be given by mouth (orally).  Though rare, surgery may be needed if the colon has torn (perforated) due to blockage. HOME CARE INSTRUCTIONS    Develop regular bowel habits. This could include getting in the habit of having a bowel movement after your morning cup of coffee or after eating. Be sure to allow yourself enough time on the toilet.  Maintain a high-fiber diet.  Drink enough fluids to keep your urine clear or pale yellow as directed by your health care provider.  Exercise regularly.  If you begin to get constipated, increase the amount of fiber in your diet. Eat plenty of fruits, vegetables, whole wheat breads, bran, oatmeal, and similar products.  Take natural fiber laxatives or other laxatives only as directed by your health care provider. SEEK MEDICAL CARE IF:   You have ongoing rectal pain.  You require enemas or suppositories more than twice a week.  You have rectal bleeding.  You have continued problems, or you develop abdominal pain.  You have thin, pencil-like stools. SEEK IMMEDIATE MEDICAL CARE IF:  You have black or tarry stools. MAKE SURE YOU:   Understand these instructions.  Will watch your condition.  Will get help right away if you are not doing well or get worse.   This information is not intended to replace advice given to you by your health care provider. Make sure you discuss any questions you have with your health care provider.   Document Released: 04/26/2004 Document Revised: 05/25/2013 Document Reviewed: 02/08/2013 Elsevier Interactive Patient Education 2016 Else[employer] Inc.   Increase fluid, fruit, fiber. Recommend fleets enema at home twice a day until symptoms resolve. Can also continue to take magnesium citrate, miralax, milk  of magnesia

## 2015-12-21 NOTE — ED Notes (Signed)
Assisted doctor with fleets enema and disimpaction and placed patient on bedside commode

## 2015-12-21 NOTE — ED Provider Notes (Addendum)
CSN: [id number]     Arrival date & time 12/21/15  1918 History   First MD Initiated Contact with Patient 12/21/15 1937     Chief Complaint  Patient presents with  . Constipation     (Consider location/radiation/quality/duration/timing/severity/associated sxs/prior Treatment) HPI...Marland KitchenMarland KitchenConstipation for several days. Patient claims last bowel movement Wednesday. She took 2 laxatives (milk of magnesia and magnesium citrate) today without any relief. No abdominal pain. Severity is moderate. Nothing makes symptoms better or worse  Past Medical History  Diagnosis Date  . Seasonal allergies   . COPD (chronic obstructive pulmonary disease) [facility]    Past Surgical History  Procedure Laterality Date  . No past surgeries    . Colonoscopy N/A 04/07/2014    Procedure: COLONOSCOPY;  Surgeon: Danie Binder, MD;  [facility];  Service: Endoscopy;  Laterality: N/A;  8:30 AM   Family History  Problem Relation Age of Onset  . Colon cancer Neg Hx    Social History  Substance Use Topics  . Smoking status: Current Some Day Smoker -- 10 years    Types: Cigarettes  . Smokeless tobacco: None     Comment: "Sometimes I just light up one and smoke it."  . Alcohol Use: No   OB History    No data available     Review of Systems  All other systems reviewed and are negative.     Allergies  Review of patient's allergies indicates no known allergies.  Home Medications   Prior to Admission medications   Medication Sig Start Date End Date Taking? Authorizing Provider  acetaminophen-codeine (TYLENOL #3) 300-30 MG tablet One tablet every four hours as needed for pain.  Must last TEN days. 11/21/15   Sanjuana Kava, MD  albuterol (PROVENTIL HFA;VENTOLIN HFA) 108 (90 BASE) MCG/ACT inhaler Inhale 1 puff into the lungs every 6 (six) hours as needed for wheezing or shortness of breath. Reported on 11/21/2015    Historical Provider, MD  fluticasone (FLONASE) 50 MCG/ACT nasal spray Place into both  nostrils daily.    Historical Provider, MD  Fluticasone-Salmeterol (ADVAIR) 100-50 MCG/DOSE AEPB Inhale 1 puff into the lungs 2 (two) times daily.    Historical Provider, MD  loratadine (CLARITIN) 10 MG tablet Take 10 mg by mouth daily. Reported on 11/28/2015    Historical Provider, MD  montelukast (SINGULAIR) 10 MG tablet Take 10 mg by mouth at bedtime. Reported on 11/28/2015    Historical Provider, MD  Sod Picosulfate-Mag Ox-Cit Acd 10-3.5-12 MG-GM-GM PACK Take 1 Container by mouth as directed. 03/20/14   Danie Binder, MD  tiotropium (SPIRIVA) 18 MCG inhalation capsule Place 18 mcg into inhaler and inhale daily. Reported on 11/28/2015    Historical Provider, MD   BP 109/73 mmHg  Pulse 86  Temp(Src) 98.2 F (36.8 C) (Oral)  Resp 20  Ht 5\' 6"  (1.676 m)  Wt 145 lb (65.772 kg)  BMI 23.41 kg/m2  SpO2 98% Physical Exam  Constitutional: She is oriented to person, place, and time. She appears well-developed and well-nourished.  HENT:  Head: Normocephalic and atraumatic.  Eyes: Conjunctivae and EOM are normal. Pupils are equal, round, and reactive to light.  Neck: Normal range of motion. Neck supple.  Cardiovascular: Normal rate and regular rhythm.   Pulmonary/Chest: Effort normal and breath sounds normal.  Abdominal: Soft. Bowel sounds are normal.  Nontender  Genitourinary:  Rectal exam: Significant fecal mass in rectum  Musculoskeletal: Normal range of motion.  Neurological: She is alert and oriented to person,  place, and time.  Skin: Skin is warm and dry.  Psychiatric: She has a normal mood and affect. Her behavior is normal.  Nursing note and vitals reviewed.   ED Course  Fecal disimpaction Date/Time: 12/21/2015 9:45 PM Performed by: Nat Christen Authorized by: Nat Christen Consent: Verbal consent obtained. Risks and benefits: risks, benefits and alternatives were discussed Consent given by: patient Patient understanding: patient states understanding of the procedure being  performed Comments: 2145:   Digital disimpaction performed by examiner. Large amount of firm stool removed. Fleets enema applied. Patient tolerated procedure well.   (including critical care time) Labs Review Labs Reviewed - No data to display  Imaging Review Dg Abd Acute W/chest  12/21/2015  CLINICAL DATA:  Patient with constipation.  Back pain. EXAM: DG ABDOMEN ACUTE W/ 1V CHEST COMPARISON:  Chest radiograph 02/20/2014 FINDINGS: Stable cardiac and mediastinal contours. No consolidative pulmonary opacities. No pleural effusion or pneumothorax. Nipple shadows overlying the lower lungs bilaterally. Stomach is distended. Gaseous distention of the colon. Stool within the rectum. Nonobstructed small bowel. No free intraperitoneal air on the upright images. IMPRESSION: Stool within the sigmoid colon and rectum. Gaseous distention of the colon. No evidence for small bowel obstruction. No acute cardiopulmonary process. Electronically Signed   By: Lovey Newcomer M.D.   On: 12/21/2015 21:20   I have personally reviewed and evaluated these images and lab results as part of my medical decision-making.   EKG Interpretation None      MDM   Final diagnoses:  Fecal impaction [facility])    Patient has significant fecal impaction. I performed a digital disimpaction with a good yield. Fleets enema given.    Nat Christen, MD 12/21/15 [place], MD 12/21/15 2229

## 2015-12-24 ENCOUNTER — Ambulatory Visit (HOSPITAL_COMMUNITY): Payer: Medicare Other | Admitting: Specialist

## 2015-12-24 DIAGNOSIS — M25512 Pain in left shoulder: Secondary | ICD-10-CM | POA: Diagnosis not present

## 2015-12-24 DIAGNOSIS — M25511 Pain in right shoulder: Secondary | ICD-10-CM | POA: Diagnosis not present

## 2015-12-24 DIAGNOSIS — M25612 Stiffness of left shoulder, not elsewhere classified: Secondary | ICD-10-CM

## 2015-12-24 DIAGNOSIS — R29898 Other symptoms and signs involving the musculoskeletal system: Secondary | ICD-10-CM

## 2015-12-24 DIAGNOSIS — M25611 Stiffness of right shoulder, not elsewhere classified: Secondary | ICD-10-CM | POA: Diagnosis not present

## 2015-12-24 NOTE — Therapy (Signed)
[place], [place], [address] Phone: [phone]   Fax:  [phone]  Occupational Therapy Treatment  Patient Details  Name: Emily Nichols MRN: [id number] Date of Birth: 29-Jun-1941 Referring Provider: Dr. Sanjuana Kava  Encounter Date: 12/24/2015      OT End of Session - 12/24/15 1611    Visit Number 4   Number of Visits 16   Date for OT Re-Evaluation 01/27/16  mini reassess on 12/28/15   Authorization Type UHC Medicare and Medicaid   Authorization Time Period before 10th visit   Authorization - Visit Number 4   Authorization - Number of Visits 10   OT Start Time 3536  arrived 15 minutes late for appointment   OT Stop Time 1612   OT Time Calculation (min) 31 min   Activity Tolerance Patient tolerated treatment well   Behavior During Therapy [facility] for tasks assessed/performed      Past Medical History  Diagnosis Date  . Seasonal allergies   . COPD (chronic obstructive pulmonary disease) [facility])     Past Surgical History  Procedure Laterality Date  . No past surgeries    . Colonoscopy N/A 04/07/2014    Procedure: COLONOSCOPY;  Surgeon: Danie Binder, MD;  [facility];  Service: Endoscopy;  Laterality: N/A;  8:30 AM    There were no vitals filed for this visit.      Subjective Assessment - 12/24/15 1541    Subjective  S:  I havent done a lot over the weekend.  I have been constipated    Currently in Pain? Yes   Pain Score 4    Pain Location Shoulder   Pain Orientation Left   Pain Descriptors / Indicators Aching   Pain Type Chronic pain   Pain Frequency Constant   Aggravating Factors  use   Pain Relieving Factors rest   Effect of Pain on Daily Activities decreased use of arm with functional activities             [facility] OT Assessment - 12/24/15 0001    Assessment   Diagnosis Bilateral Shoulder Pain   Precautions   Precautions None                  OT  Treatments/Exercises (OP) - 12/24/15 0001    Exercises   Exercises Shoulder   Shoulder Exercises: Supine   Protraction PROM;5 reps   Horizontal ABduction PROM;5 reps   External Rotation PROM;5 reps   Internal Rotation PROM;5 reps   Flexion PROM;5 reps   ABduction PROM;5 reps   Shoulder Exercises: Standing   Protraction AAROM;10 reps   Horizontal ABduction AAROM;10 reps   External Rotation AAROM;10 reps   Internal Rotation AAROM;10 reps   Flexion AAROM;10 reps   ABduction AAROM;10 reps   Shoulder Exercises: ROM/Strengthening   UBE (Upper Arm Bike) 3' forward and 3' reverse at 1.0   Wall Wash 2' with LUE   Manual Therapy   Manual Therapy Myofascial release   Manual therapy comments manual therapy completed seperately from all other treatment interventions this date   Myofascial Release Myofascial release to left upper arm, scapular, and shoulder regions to decrease pain and restrictions and improve pain free mobility in bilateral shoulders                   OT Short Term Goals - 12/05/15 1615    OT SHORT TERM GOAL #1   Title Patient will be  educated on a HEP for improved A/ROM in bilateral shoulders for improved independence with dressing tasks.   Time 4   Period Weeks   Status On-going   OT SHORT TERM GOAL #2   Title Patient will improve bilateral shoulder A/ROM by 20 degrees for improved ability to don and doff shirts.    Time 4   Period Weeks   Status On-going   OT SHORT TERM GOAL #3   Title Patient will improve bilateral shoulder strength to 4-/5 for increased ability to lift pots and pans when cooking.    Time 4   Period Weeks   Status On-going   OT SHORT TERM GOAL #4   Title Patient will decrease pain in bilateral shoulders to 2/10 with functional tasks.   Time 4   Period Weeks   Status On-going   OT SHORT TERM GOAL #5   Title Patient will decrease fascial restrictions to min-mod in her shoulders in order to have increased mobility with functional tasks     Time 4   Period Weeks   Status On-going           OT Long Term Goals - 12/05/15 1615    OT LONG TERM GOAL #1   Title Patient will return to prior level of independence with all B/IADLS and leisure tasks, using right hand as dominant.    Time 8   Period Weeks   Status On-going   OT LONG TERM GOAL #2   Title Patient will improve bilateral shoulder A/ROM to [facility] for improved independence placing dishes into overhead cabinets.   Time 8   Period Weeks   Status On-going   OT LONG TERM GOAL #3   Title Patient will improve bilateral shoulder strength to 4/5 for increased ability to carry shopping bags.    Time 8   Period Weeks   Status On-going   OT LONG TERM GOAL #4   Title Patient will decrease pain in her shoulders to 1/10 or better when completing daily tasks.   Time 8   Period Weeks   Status On-going   OT LONG TERM GOAL #5   Title Patient will have min fasical and muscle tightness in her shoulders for improved functional mobility needed for daily tasks.    Time 8   Period Weeks   Status On-going               Plan - 12/24/15 1613    Clinical Impression Statement A:  added UBE and wall wash, UBE was quite challenging for patient and had to stop short of entire time    Plan P:  Focus on achieving end range A/ROM add thumbtacks and prot/ret/elev/dep, mini reassessment   Consulted and Agree with Plan of Care Patient      Patient will benefit from skilled therapeutic intervention in order to improve the following deficits and impairments:  Decreased range of motion, Decreased strength, Increased fascial restricitons, Increased muscle spasms, Impaired flexibility, Pain  Visit Diagnosis: Pain in left shoulder  Other symptoms and signs involving the musculoskeletal system  Stiffness of left shoulder, not elsewhere classified    Problem List There are no active problems to display for this patient.   Vangie Bicker, OTR/L [phone]  12/24/2015, 4:16  PM  [facility] [address] [place], [place], [address] Phone: [phone]   Fax:  [phone]  Name: Emily Nichols MRN: [id number] Date of Birth: 1940-09-06

## 2015-12-26 ENCOUNTER — Encounter (HOSPITAL_COMMUNITY): Payer: Medicare Other | Admitting: Occupational Therapy

## 2015-12-28 DIAGNOSIS — J329 Chronic sinusitis, unspecified: Secondary | ICD-10-CM | POA: Diagnosis not present

## 2015-12-28 DIAGNOSIS — M1990 Unspecified osteoarthritis, unspecified site: Secondary | ICD-10-CM | POA: Diagnosis not present

## 2015-12-28 DIAGNOSIS — J4520 Mild intermittent asthma, uncomplicated: Secondary | ICD-10-CM | POA: Diagnosis not present

## 2015-12-28 DIAGNOSIS — J309 Allergic rhinitis, unspecified: Secondary | ICD-10-CM | POA: Diagnosis not present

## 2015-12-31 ENCOUNTER — Ambulatory Visit (HOSPITAL_COMMUNITY): Payer: Medicare Other | Admitting: Specialist

## 2015-12-31 DIAGNOSIS — M25612 Stiffness of left shoulder, not elsewhere classified: Secondary | ICD-10-CM | POA: Diagnosis not present

## 2015-12-31 DIAGNOSIS — M25512 Pain in left shoulder: Secondary | ICD-10-CM

## 2015-12-31 DIAGNOSIS — R29898 Other symptoms and signs involving the musculoskeletal system: Secondary | ICD-10-CM

## 2015-12-31 DIAGNOSIS — M25611 Stiffness of right shoulder, not elsewhere classified: Secondary | ICD-10-CM

## 2015-12-31 DIAGNOSIS — M25511 Pain in right shoulder: Secondary | ICD-10-CM | POA: Diagnosis not present

## 2015-12-31 NOTE — Therapy (Signed)
[place], [place], [address] Phone: [phone]   Fax:  [phone]  Occupational Therapy Treatment and reassessment  Patient Details  Name: Emily Nichols MRN: [id number] Date of Birth: [birth year] Referring Provider: Dr. Sanjuana Kava  Encounter Date: 12/31/2015      OT End of Session - 12/31/15 1546    Visit Number 5   Number of Visits 16   Date for OT Re-Evaluation 01/27/16   Authorization Type [employer] Medicare and Medicaid   Authorization Time Period before 15th visit   Authorization - Visit Number 5   Authorization - Number of Visits 15   OT Start Time 1520   OT Stop Time 1608   OT Time Calculation (min) 48 min   Activity Tolerance Patient tolerated treatment well   Behavior During Therapy [facility] for tasks assessed/performed      Past Medical History  Diagnosis Date  . Seasonal allergies   . COPD (chronic obstructive pulmonary disease) [facility])     Past Surgical History  Procedure Laterality Date  . No past surgeries    . Colonoscopy N/A 04/07/2014    Procedure: COLONOSCOPY;  Surgeon: Danie Binder, MD;  [facility];  Service: Endoscopy;  Laterality: N/A;  8:30 AM    There were no vitals filed for this visit.      Subjective Assessment - 12/31/15 1522    Subjective  S:  I havent been doing my exercises like I should.   Pain Score 2    Pain Location Shoulder   Pain Orientation Left   Pain Descriptors / Indicators Aching   Pain Type Chronic pain   Pain Onset More than a month ago   Pain Frequency Constant   Aggravating Factors  use   Pain Relieving Factors rest   Effect of Pain on Daily Activities decreased use or left arm with functional activities             [facility] OT Assessment - 12/31/15 0001    Assessment   Diagnosis Bilateral Shoulder Pain   Precautions   Precautions None   Restrictions   Weight Bearing Restrictions No   ADL   ADL comments donning shirts and bra is  easier, reaching overhead is still difficult   Observation/Other Assessments   Focus on Therapeutic Outcomes (FOTO)  61.66/100   Palpation   Palpation comment min-moderate fascial restrictions    AROM   Overall AROM Comments assessed in seated, external rotation and internal rotation with shoulder adducted, (11/28/15)   Right/Left Shoulder Right;Left   Right Shoulder Flexion 95 Degrees  90   Right Shoulder ABduction 82 Degrees  90   Right Shoulder Internal Rotation 90 Degrees  78   Right Shoulder External Rotation 75 Degrees  65   Left Shoulder Flexion 80 Degrees  72   Left Shoulder ABduction 78 Degrees  76   Left Shoulder Internal Rotation 90 Degrees  90   Left Shoulder External Rotation 75 Degrees  65   Strength   Right Shoulder Flexion 3+/5   Right Shoulder ABduction 3+/5   Right Shoulder External Rotation 3+/5   Left Shoulder Flexion 3+/5   Left Shoulder ABduction 3+/5   Left Shoulder Internal Rotation 3+/5   Left Shoulder External Rotation 3+/5                  OT Treatments/Exercises (OP) - 12/31/15 0001    Exercises   Exercises Shoulder   Shoulder Exercises:  Supine   Protraction PROM;5 reps   Horizontal ABduction PROM;5 reps   External Rotation PROM;5 reps   Internal Rotation PROM;5 reps   Flexion PROM;5 reps   ABduction PROM;5 reps   Shoulder Exercises: Standing   Extension Theraband;10 reps   Theraband Level (Shoulder Extension) Level 2 (Red)   Row Theraband;10 reps   Theraband Level (Shoulder Row) Level 2 (Red)   Shoulder Exercises: Pulleys   Flexion 1 minute   ABduction 1 minute   Shoulder Exercises: ROM/Strengthening   UBE (Upper Arm Bike) 3' forward and 3' reverse at 1.0   Manual Therapy   Manual Therapy Myofascial release   Manual therapy comments manual therapy completed seperately from all other treatment interventions this date   Myofascial Release Myofascial release to left upper arm, scapular, and shoulder regions to decrease pain  and restrictions and improve pain free mobility in bilateral shoulders                   OT Short Term Goals - 12/31/15 1543    OT SHORT TERM GOAL #1   Title Patient will be educated on a HEP for improved A/ROM in bilateral shoulders for improved independence with dressing tasks.   Time 4   Period Weeks   Status On-going   OT SHORT TERM GOAL #2   Title Patient will improve bilateral shoulder A/ROM by 20 degrees for improved ability to don and doff shirts.    Time 4   Period Weeks   Status On-going   OT SHORT TERM GOAL #3   Title Patient will improve bilateral shoulder strength to 4-/5 for increased ability to lift pots and pans when cooking.    Time 4   Period Weeks   Status On-going   OT SHORT TERM GOAL #4   Title Patient will decrease pain in bilateral shoulders to 2/10 with functional tasks.   Time 4   Period Weeks   Status Achieved   OT SHORT TERM GOAL #5   Title Patient will decrease fascial restrictions to min-mod in her shoulders in order to have increased mobility with functional tasks    Time 4   Period Weeks   Status Achieved           OT Long Term Goals - 12/31/15 1545    OT LONG TERM GOAL #1   Title Patient will return to prior level of independence with all B/IADLS and leisure tasks, using right hand as dominant.    Time 8   Period Weeks   Status On-going   OT LONG TERM GOAL #2   Title Patient will improve bilateral shoulder A/ROM to [facility] for improved independence placing dishes into overhead cabinets.   Time 8   Period Weeks   Status On-going   OT LONG TERM GOAL #3   Title Patient will improve bilateral shoulder strength to 4/5 for increased ability to carry shopping bags.    Time 8   Period Weeks   Status On-going   OT LONG TERM GOAL #4   Title Patient will decrease pain in her shoulders to 1/10 or better when completing daily tasks.   Time 8   Period Weeks   Status On-going   OT LONG TERM GOAL #5   Title Patient will have min fasical  and muscle tightness in her shoulders for improved functional mobility needed for daily tasks.    Time 8   Period Weeks   Status On-going  Plan - Jan 15, 2016 1609    Clinical Impression Statement A: Mini reassessment completed this date.  Patient has had 5 OT visits since 11/28/15.  She has improved all A/ROM slightly, strength has remained the same.  Her pain level has decreased.  Patient would benefit from continued skilled OT intervention to improve A/ROM and strength and decrease pain so that she can use BUE fully with functional activities.     OT Frequency 2x / week   OT Duration 4 weeks   OT Treatment/Interventions Self-care/ADL training;Cryotherapy;Ultrasound;Moist Heat;Electrical Stimulation;DME and/or AE instruction;Neuromuscular education;Therapeutic exercise;Manual Therapy;Passive range of motion;Therapeutic exercises;Therapeutic activities;Patient/family education   Plan P:  Add AA/ROM and A/ROM to HEP, focus on functional reaching activities to improve abilitiy to reach overhead at home and improve scapular stability.        Patient will benefit from skilled therapeutic intervention in order to improve the following deficits and impairments:  Decreased range of motion, Decreased strength, Increased fascial restricitons, Increased muscle spasms, Impaired flexibility, Pain  Visit Diagnosis: Pain in left shoulder  Other symptoms and signs involving the musculoskeletal system  Stiffness of left shoulder, not elsewhere classified  Pain in right shoulder  Stiffness of right shoulder, not elsewhere classified      G-Codes - 2016-01-15 1612    Functional Assessment Tool Used FOTO 61.66, 38% impaired   Functional Limitation Self care   Self Care Current Status [id number]) At least 20 percent but less than 40 percent impaired, limited or restricted   Self Care Goal Status [id number]) At least 1 percent but less than 20 percent impaired, limited or restricted       Problem List There are no active problems to display for this patient.   Vangie Bicker, OTR/L [phone]  01-15-2016, 4:13 PM  [place] [address] [place], [place], [address] Phone: [phone]   Fax:  [phone]  Name: Emily Nichols MRN: [id number] Date of Birth: Feb 16, 1941

## 2016-01-02 ENCOUNTER — Encounter: Payer: Self-pay | Admitting: Orthopaedic Surgery

## 2016-01-02 ENCOUNTER — Ambulatory Visit (INDEPENDENT_AMBULATORY_CARE_PROVIDER_SITE_OTHER): Payer: Medicare Other | Admitting: Orthopaedic Surgery

## 2016-01-02 ENCOUNTER — Encounter (HOSPITAL_COMMUNITY): Payer: Medicare Other

## 2016-01-02 VITALS — BP 118/69 | HR 71 | Temp 97.7°F | Resp 16 | Ht 66.0 in | Wt 146.0 lb

## 2016-01-02 DIAGNOSIS — M25512 Pain in left shoulder: Secondary | ICD-10-CM

## 2016-01-02 DIAGNOSIS — R634 Abnormal weight loss: Secondary | ICD-10-CM | POA: Diagnosis not present

## 2016-01-02 NOTE — Progress Notes (Signed)
Patient Emily Nichols:[id number] Emily Nichols, female DOB:1941/02/10, 75 y.o. [id number]  Chief Complaint  Patient presents with  . Follow-up    left shoulder pain    HPI  [name] is a 75 y.o. female who is seen for left shoulder pain.  She is improved. She has less pain and less problems.  She is doing her exercises.  She still has problems initiating motion but does well once she gets started.  She has no paresthesias or new problems.    HPI  Body mass index is 23.58 kg/(m^2).  ROS  Review of Systems  Constitutional:       She does smoke.  HENT: Negative for congestion.   Respiratory: Positive for shortness of breath. Negative for cough.   Cardiovascular: Negative for chest pain and leg swelling.  Endocrine: Positive for cold intolerance.  Musculoskeletal: Positive for myalgias, joint swelling and arthralgias.  Allergic/Immunologic: Positive for environmental allergies.    Past Medical History  Diagnosis Date  . Seasonal allergies   . COPD (chronic obstructive pulmonary disease) [facility])     Past Surgical History  Procedure Laterality Date  . No past surgeries    . Colonoscopy N/A 04/07/2014    Procedure: COLONOSCOPY;  Surgeon: Danie Binder, MD;  [facility];  Service: Endoscopy;  Laterality: N/A;  8:30 AM    Family History  Problem Relation Age of Onset  . Colon cancer Neg Hx     Social History Social History  Substance Use Topics  . Smoking status: Current Some Day Smoker -- 10 years    Types: Cigarettes  . Smokeless tobacco: None     Comment: "Sometimes I just light up one and smoke it."  . Alcohol Use: No    No Known Allergies  Current Outpatient Prescriptions  Medication Sig Dispense Refill  . acetaminophen-codeine (TYLENOL #3) 300-30 MG tablet One tablet every four hours as needed for pain.  Must last TEN days. 40 tablet 2  . ADVAIR DISKUS 250-50 MCG/DOSE AEPB INHALE ONE PUFF BY MOUTH TWICE DAILY  3  . albuterol (PROVENTIL HFA;VENTOLIN HFA) 108  (90 BASE) MCG/ACT inhaler Inhale 1 puff into the lungs every 6 (six) hours as needed for wheezing or shortness of breath. Reported on 11/21/2015    . fluticasone (FLONASE) 50 MCG/ACT nasal spray Place into both nostrils daily.    . Fluticasone-Salmeterol (ADVAIR) 100-50 MCG/DOSE AEPB Inhale 1 puff into the lungs 2 (two) times daily.    Marland Kitchen loratadine (CLARITIN) 10 MG tablet Take 10 mg by mouth daily. Reported on 11/28/2015    . montelukast (SINGULAIR) 10 MG tablet Take 10 mg by mouth at bedtime. Reported on 11/28/2015    . Sod Picosulfate-Mag Ox-Cit Acd 10-3.5-12 MG-GM-GM PACK Take 1 Container by mouth as directed. 1 each 0  . tiotropium (SPIRIVA) 18 MCG inhalation capsule Place 18 mcg into inhaler and inhale daily. Reported on 11/28/2015     No current facility-administered medications for this visit.     Physical Exam  Blood pressure 118/69, pulse 71, temperature 97.7 F (36.5 C), resp. rate 16, height 5\' 6"  (1.676 m), weight 146 lb (66.225 kg).  Constitutional: overall normal hygiene, normal nutrition, well developed, normal grooming, normal body habitus. Assistive device:none  Musculoskeletal: gait and station Limp none, muscle tone and strength are normal, no tremors or atrophy is present.  .  Neurological: coordination overall normal.  Deep tendon reflex/nerve stretch intact.  Sensation normal.  Cranial nerves II-XII intact.   Skin:  normal overall no scars, lesions, ulcers or rashes. No psoriasis.  Psychiatric: Alert and oriented x 3.  Recent memory intact, remote memory unclear.  Normal mood and affect. Well groomed.  Good eye contact.  Cardiovascular: overall no swelling, no varicosities, no edema bilaterally, normal temperatures of the legs and arms, no clubbing, cyanosis and good capillary refill.  Lymphatic: palpation is normal.  Examination of left Upper Extremity is done.  Inspection:   Overall:  Elbow non-tender without crepitus or defects, forearm non-tender without  crepitus or defects, wrist non-tender without crepitus or defects, hand non-tender.    Shoulder: with glenohumeral joint tenderness, without effusion.   Upper arm: without swelling and tenderness   Range of motion:   Overall:  Full range of motion of the elbow, full range of motion of wrist and full range of motion in fingers.   Shoulder:  left  145 degrees forward flexion; 90 degrees abduction; 30 degrees internal rotation, 30 degrees external rotation, 20 degrees extension, 40 degrees adduction.   Stability:   Overall:  Shoulder, elbow and wrist stable   Strength and Tone:   Overall full shoulder muscles strength, full upper arm strength and normal upper arm bulk and tone.  The patient has been educated about the nature of the problem(s) and counseled on treatment options.  The patient appeared to understand what I have discussed and is in agreement with it.  Encounter Diagnoses  Name Primary?  . Left shoulder pain Yes  . Loss of weight     PLAN Call if any problems.  Precautions discussed.  Continue current medications.   Return to clinic 2 months

## 2016-01-07 ENCOUNTER — Ambulatory Visit (HOSPITAL_COMMUNITY): Payer: Medicare Other

## 2016-01-07 DIAGNOSIS — M25511 Pain in right shoulder: Secondary | ICD-10-CM

## 2016-01-07 DIAGNOSIS — M25611 Stiffness of right shoulder, not elsewhere classified: Secondary | ICD-10-CM

## 2016-01-07 DIAGNOSIS — M25512 Pain in left shoulder: Secondary | ICD-10-CM | POA: Diagnosis not present

## 2016-01-07 DIAGNOSIS — M25612 Stiffness of left shoulder, not elsewhere classified: Secondary | ICD-10-CM

## 2016-01-07 DIAGNOSIS — R29898 Other symptoms and signs involving the musculoskeletal system: Secondary | ICD-10-CM | POA: Diagnosis not present

## 2016-01-07 NOTE — Therapy (Signed)
[place], [place], [address] Phone: [phone]   Fax:  [phone]  Occupational Therapy Treatment  Patient Details  Name: Emily Nichols MRN: [id number] Date of Birth: 07/08/1941 Referring Provider: Dr. Sanjuana Kava  Encounter Date: 01/07/2016      OT End of Session - 01/07/16 1640    Visit Number 6   Number of Visits 16   Date for OT Re-Evaluation 01/27/16   Authorization Type [employer] Medicare and Medicaid   Authorization Time Period before 15th visit   Authorization - Visit Number 6   Authorization - Number of Visits 15   OT Start Time 1518   OT Stop Time 1600   OT Time Calculation (min) 42 min   Activity Tolerance Patient tolerated treatment well   Behavior During Therapy [facility] for tasks assessed/performed      Past Medical History  Diagnosis Date  . Seasonal allergies   . COPD (chronic obstructive pulmonary disease) [facility])     Past Surgical History  Procedure Laterality Date  . No past surgeries    . Colonoscopy N/A 04/07/2014    Procedure: COLONOSCOPY;  Surgeon: Danie Binder, MD;  [facility];  Service: Endoscopy;  Laterality: N/A;  8:30 AM    There were no vitals filed for this visit.                    OT Treatments/Exercises (OP) - 01/07/16 0001    Exercises   Exercises Shoulder   Shoulder Exercises: Standing   Protraction AAROM;12 reps   Horizontal ABduction AAROM;12 reps   External Rotation AAROM;12 reps   Internal Rotation AAROM;12 reps   Flexion AAROM;12 reps   ABduction AAROM;12 reps   Extension Theraband;10 reps   Theraband Level (Shoulder Extension) Level 2 (Red)   Row Theraband;10 reps   Theraband Level (Shoulder Row) Level 2 (Red)   Shoulder Exercises: ROM/Strengthening   Wall Wash 2' with LUE                  OT Short Term Goals - 01/07/16 1643    OT SHORT TERM GOAL #1   Title Patient will be educated on a HEP for improved A/ROM in  bilateral shoulders for improved independence with dressing tasks.   Time 4   Period Weeks   Status On-going   OT SHORT TERM GOAL #2   Title Patient will improve bilateral shoulder A/ROM by 20 degrees for improved ability to don and doff shirts.    Time 4   Period Weeks   Status On-going   OT SHORT TERM GOAL #3   Title Patient will improve bilateral shoulder strength to 4-/5 for increased ability to lift pots and pans when cooking.    Time 4   Period Weeks   Status On-going   OT SHORT TERM GOAL #4   Title Patient will decrease pain in bilateral shoulders to 2/10 with functional tasks.   Time 4   Period Weeks   OT SHORT TERM GOAL #5   Title Patient will decrease fascial restrictions to min-mod in her shoulders in order to have increased mobility with functional tasks    Time 4   Period Weeks           OT Long Term Goals - 12/31/15 1545    OT LONG TERM GOAL #1   Title Patient will return to prior level of independence with all B/IADLS and leisure tasks, using  right hand as dominant.    Time 8   Period Weeks   Status On-going   OT LONG TERM GOAL #2   Title Patient will improve bilateral shoulder A/ROM to [facility] for improved independence placing dishes into overhead cabinets.   Time 8   Period Weeks   Status On-going   OT LONG TERM GOAL #3   Title Patient will improve bilateral shoulder strength to 4/5 for increased ability to carry shopping bags.    Time 8   Period Weeks   Status On-going   OT LONG TERM GOAL #4   Title Patient will decrease pain in her shoulders to 1/10 or better when completing daily tasks.   Time 8   Period Weeks   Status On-going   OT LONG TERM GOAL #5   Title Patient will have min fasical and muscle tightness in her shoulders for improved functional mobility needed for daily tasks.    Time 8   Period Weeks   Status On-going               Plan - 01/07/16 1641    Clinical Impression Statement A: Pt somewhat resistive to therapist changing  exercises or progressing her to 12 repetitions for AA/ROM. Pt states she is unable to complete AA/ROM supine and requests to complete standing instead.    Plan P: Add scapular theraband to HEP if apprpropriate. Add PVC pipe and complete graduated clothespin task to increase reaching overhead at home.       Patient will benefit from skilled therapeutic intervention in order to improve the following deficits and impairments:  Decreased range of motion, Decreased strength, Increased fascial restricitons, Increased muscle spasms, Impaired flexibility, Pain  Visit Diagnosis: Pain in left shoulder  Other symptoms and signs involving the musculoskeletal system  Stiffness of left shoulder, not elsewhere classified  Pain in right shoulder  Stiffness of right shoulder, not elsewhere classified    Problem List There are no active problems to display for this patient.   Emily Nichols, OTR/L,CBIS  [phone]  01/07/2016, 5:05 PM  [place] [address] [place], [place], [address] Phone: [phone]   Fax:  [phone]  Name: Emily Nichols MRN: [id number] Date of Birth: 1941-06-21

## 2016-01-09 ENCOUNTER — Encounter (HOSPITAL_COMMUNITY): Payer: Medicare Other

## 2016-01-15 ENCOUNTER — Encounter (HOSPITAL_COMMUNITY): Payer: Self-pay | Admitting: Occupational Therapy

## 2016-01-15 ENCOUNTER — Ambulatory Visit (HOSPITAL_COMMUNITY): Payer: Medicare Other | Admitting: Occupational Therapy

## 2016-01-15 DIAGNOSIS — R29898 Other symptoms and signs involving the musculoskeletal system: Secondary | ICD-10-CM

## 2016-01-15 DIAGNOSIS — M25612 Stiffness of left shoulder, not elsewhere classified: Secondary | ICD-10-CM

## 2016-01-15 DIAGNOSIS — M25512 Pain in left shoulder: Secondary | ICD-10-CM

## 2016-01-15 DIAGNOSIS — M25511 Pain in right shoulder: Secondary | ICD-10-CM

## 2016-01-15 DIAGNOSIS — M25611 Stiffness of right shoulder, not elsewhere classified: Secondary | ICD-10-CM

## 2016-01-15 NOTE — Therapy (Signed)
[place], [place], [address] Phone: [phone]   Fax:  [phone]  Occupational Therapy Treatment  Patient Details  Name: Emily Nichols MRN: [id number] Date of Birth: 1941/07/07 Referring Provider: Dr. Sanjuana Kava  Encounter Date: 01/15/2016      OT End of Session - 01/15/16 1603    Visit Number 7   Number of Visits 16   Date for OT Re-Evaluation 01/27/16   Authorization Type UHC Medicare and Medicaid   Authorization Time Period before 15th visit   Authorization - Visit Number 7   Authorization - Number of Visits 15   OT Start Time 1510   OT Stop Time 1555   OT Time Calculation (min) 45 min   Activity Tolerance Patient tolerated treatment well   Behavior During Therapy [facility] for tasks assessed/performed      Past Medical History  Diagnosis Date  . Seasonal allergies   . COPD (chronic obstructive pulmonary disease) [facility])     Past Surgical History  Procedure Laterality Date  . No past surgeries    . Colonoscopy N/A 04/07/2014    Procedure: COLONOSCOPY;  Surgeon: Danie Binder, MD;  [facility];  Service: Endoscopy;  Laterality: N/A;  8:30 AM    There were no vitals filed for this visit.      Subjective Assessment - 01/15/16 1508    Subjective  S: I've been doing that towel thing and the band.    Currently in Pain? Yes   Pain Score 1    Pain Location Shoulder   Pain Orientation Right;Left   Pain Descriptors / Indicators Sore   Pain Type Chronic pain   Pain Radiating Towards n/a   Pain Onset More than a month ago   Pain Frequency Constant   Aggravating Factors  use   Pain Relieving Factors rest   Effect of Pain on Daily Activities limited completion of ADL tasks   Multiple Pain Sites No            [facility] OT Assessment - 01/15/16 1508    Assessment   Diagnosis Bilateral Shoulder Pain   Precautions   Precautions None                  OT  Treatments/Exercises (OP) - 01/15/16 1513    Exercises   Exercises Shoulder   Shoulder Exercises: Standing   Protraction AAROM;10 reps   Flexion AAROM;10 reps   Extension Theraband;10 reps   Theraband Level (Shoulder Extension) Level 2 (Red)   Row Theraband;10 reps   Theraband Level (Shoulder Row) Level 2 (Red)   Shoulder Exercises: Pulleys   Flexion 1 minute   ABduction 1 minute   Functional Reaching Activities   Mid Level Pt completed resistive clothespin tree, focusing on shoulder protraction and flexion while placing clothespins along vertical pole. Pt able to place clothespins up to 8inches from top of pole. Pt placed with LUE and removed with RUE.   Manual Therapy   Manual Therapy Myofascial release   Manual therapy comments manual therapy completed seperately from all other treatment interventions this date   Myofascial Release Myofascial release to left upper arm, scapular, and shoulder regions to decrease pain and restrictions and improve pain free mobility in bilateral shoulders                   OT Short Term Goals - 01/07/16 1643    OT SHORT TERM GOAL #1  Title Patient will be educated on a HEP for improved A/ROM in bilateral shoulders for improved independence with dressing tasks.   Time 4   Period Weeks   Status On-going   OT SHORT TERM GOAL #2   Title Patient will improve bilateral shoulder A/ROM by 20 degrees for improved ability to don and doff shirts.    Time 4   Period Weeks   Status On-going   OT SHORT TERM GOAL #3   Title Patient will improve bilateral shoulder strength to 4-/5 for increased ability to lift pots and pans when cooking.    Time 4   Period Weeks   Status On-going   OT SHORT TERM GOAL #4   Title Patient will decrease pain in bilateral shoulders to 2/10 with functional tasks.   Time 4   Period Weeks   OT SHORT TERM GOAL #5   Title Patient will decrease fascial restrictions to min-mod in her shoulders in order to have increased  mobility with functional tasks    Time 4   Period Weeks           OT Long Term Goals - 12/31/15 1545    OT LONG TERM GOAL #1   Title Patient will return to prior level of independence with all B/IADLS and leisure tasks, using right hand as dominant.    Time 8   Period Weeks   Status On-going   OT LONG TERM GOAL #2   Title Patient will improve bilateral shoulder A/ROM to [facility] for improved independence placing dishes into overhead cabinets.   Time 8   Period Weeks   Status On-going   OT LONG TERM GOAL #3   Title Patient will improve bilateral shoulder strength to 4/5 for increased ability to carry shopping bags.    Time 8   Period Weeks   Status On-going   OT LONG TERM GOAL #4   Title Patient will decrease pain in her shoulders to 1/10 or better when completing daily tasks.   Time 8   Period Weeks   Status On-going   OT LONG TERM GOAL #5   Title Patient will have min fasical and muscle tightness in her shoulders for improved functional mobility needed for daily tasks.    Time 8   Period Weeks   Status On-going               Plan - 01/15/16 1603    Clinical Impression Statement A: Pt resistive to completing AA/ROM in sitting and standing this session, discussed exercises and purpose of each exercise to pt. Added pinch tree, pt able to perform shoulder protraction and flexion with BUE during task. Did not add scapular theraband to HEP due to pt continuing to require consistent verbal cuing during task for form and technique.    Rehab Potential Good   OT Frequency 2x / week   OT Duration 4 weeks   OT Treatment/Interventions Self-care/ADL training;Cryotherapy;Ultrasound;Moist Heat;Electrical Stimulation;DME and/or AE instruction;Neuromuscular education;Therapeutic exercise;Manual Therapy;Passive range of motion;Therapeutic exercises;Therapeutic activities;Patient/family education   Plan P: Continue to work on form and independence during exercises. Add prot/ret/elev/dep.     Consulted and Agree with Plan of Care Patient      Patient will benefit from skilled therapeutic intervention in order to improve the following deficits and impairments:  Decreased range of motion, Decreased strength, Increased fascial restricitons, Increased muscle spasms, Impaired flexibility, Pain  Visit Diagnosis: Pain in left shoulder  Other symptoms and signs involving the musculoskeletal system  Stiffness  of left shoulder, not elsewhere classified  Pain in right shoulder  Stiffness of right shoulder, not elsewhere classified    Problem List There are no active problems to display for this patient.   Guadelupe Sabin, OTR/L  [phone]  01/15/2016, 4:09 PM  [place] [address] [place], [place], [address] Phone: [phone]   Fax:  [phone]  Name: Emily Nichols MRN: [id number] Date of Birth: [birth year]

## 2016-01-17 ENCOUNTER — Encounter (HOSPITAL_COMMUNITY): Payer: Medicare Other

## 2016-01-22 ENCOUNTER — Ambulatory Visit (HOSPITAL_COMMUNITY): Payer: Medicare Other | Attending: Orthopaedic Surgery | Admitting: Occupational Therapy

## 2016-01-22 ENCOUNTER — Encounter (HOSPITAL_COMMUNITY): Payer: Self-pay | Admitting: Occupational Therapy

## 2016-01-22 DIAGNOSIS — M25512 Pain in left shoulder: Secondary | ICD-10-CM

## 2016-01-22 DIAGNOSIS — M25612 Stiffness of left shoulder, not elsewhere classified: Secondary | ICD-10-CM

## 2016-01-22 DIAGNOSIS — R29898 Other symptoms and signs involving the musculoskeletal system: Secondary | ICD-10-CM | POA: Insufficient documentation

## 2016-01-22 DIAGNOSIS — M25611 Stiffness of right shoulder, not elsewhere classified: Secondary | ICD-10-CM | POA: Diagnosis present

## 2016-01-22 NOTE — Therapy (Signed)
[place], [place], [address] Phone: [phone]   Fax:  [phone]  Occupational Therapy Treatment  Patient Details  Name: Emily Nichols MRN: [id number] Date of Birth: March 15, 1941 Referring Provider: Dr. Sanjuana Kava  Encounter Date: 01/22/2016      OT End of Session - 01/22/16 1602    Visit Number 8   Number of Visits 16   Date for OT Re-Evaluation 01/27/16   Authorization Type UHC Medicare and Medicaid   Authorization Time Period before 15th visit   Authorization - Visit Number 8   Authorization - Number of Visits 15   OT Start Time [id number]   OT Stop Time 1559   OT Time Calculation (min) 43 min   Activity Tolerance Patient tolerated treatment well   Behavior During Therapy [facility] for tasks assessed/performed      Past Medical History  Diagnosis Date  . Seasonal allergies   . COPD (chronic obstructive pulmonary disease) [facility])     Past Surgical History  Procedure Laterality Date  . No past surgeries    . Colonoscopy N/A 04/07/2014    Procedure: COLONOSCOPY;  Surgeon: Danie Binder, MD;  [facility];  Service: Endoscopy;  Laterality: N/A;  8:30 AM    There were no vitals filed for this visit.      Subjective Assessment - 01/22/16 1516    Subjective  S: I haven't been doing my exercises like I should have.    Currently in Pain? Yes   Pain Score 3    Pain Location Shoulder   Pain Orientation Left   Pain Descriptors / Indicators Sore   Pain Type Chronic pain   Pain Radiating Towards n/a    Pain Onset More than a month ago   Pain Frequency Constant   Aggravating Factors  use   Pain Relieving Factors rest   Effect of Pain on Daily Activities limited ability to completion of ADL tasks   Multiple Pain Sites No            [facility] OT Assessment - 01/22/16 1516    Assessment   Diagnosis Bilateral Shoulder Pain   Precautions   Precautions None                  OT  Treatments/Exercises (OP) - 01/22/16 1520    Exercises   Exercises Shoulder   Shoulder Exercises: Supine   Protraction PROM;5 reps   Horizontal ABduction PROM;5 reps   External Rotation PROM;5 reps   Internal Rotation PROM;5 reps   Flexion PROM;5 reps   ABduction PROM;5 reps   Shoulder Exercises: Standing   Protraction AAROM;12 reps   Horizontal ABduction AAROM;12 reps   External Rotation AAROM;12 reps   Internal Rotation AAROM;12 reps   Flexion AAROM;12 reps   ABduction AAROM;12 reps   Extension AROM;10 reps   Row AROM;10 reps   Shoulder Elevation AROM;10 reps   Other Standing Exercises PVC pipe slide, 12X LUE   Manual Therapy   Manual Therapy Myofascial release   Manual therapy comments manual therapy completed seperately from all other treatment interventions this date   Myofascial Release Myofascial release to left upper arm, scapular, and shoulder regions to decrease pain and restrictions and improve pain free mobility in bilateral shoulders                   OT Short Term Goals - 01/07/16 1643    OT SHORT TERM GOAL #1  Title Patient will be educated on a HEP for improved A/ROM in bilateral shoulders for improved independence with dressing tasks.   Time 4   Period Weeks   Status On-going   OT SHORT TERM GOAL #2   Title Patient will improve bilateral shoulder A/ROM by 20 degrees for improved ability to don and doff shirts.    Time 4   Period Weeks   Status On-going   OT SHORT TERM GOAL #3   Title Patient will improve bilateral shoulder strength to 4-/5 for increased ability to lift pots and pans when cooking.    Time 4   Period Weeks   Status On-going   OT SHORT TERM GOAL #4   Title Patient will decrease pain in bilateral shoulders to 2/10 with functional tasks.   Time 4   Period Weeks   OT SHORT TERM GOAL #5   Title Patient will decrease fascial restrictions to min-mod in her shoulders in order to have increased mobility with functional tasks    Time  4   Period Weeks           OT Long Term Goals - 12/31/15 1545    OT LONG TERM GOAL #1   Title Patient will return to prior level of independence with all B/IADLS and leisure tasks, using right hand as dominant.    Time 8   Period Weeks   Status On-going   OT LONG TERM GOAL #2   Title Patient will improve bilateral shoulder A/ROM to [facility] for improved independence placing dishes into overhead cabinets.   Time 8   Period Weeks   Status On-going   OT LONG TERM GOAL #3   Title Patient will improve bilateral shoulder strength to 4/5 for increased ability to carry shopping bags.    Time 8   Period Weeks   Status On-going   OT LONG TERM GOAL #4   Title Patient will decrease pain in her shoulders to 1/10 or better when completing daily tasks.   Time 8   Period Weeks   Status On-going   OT LONG TERM GOAL #5   Title Patient will have min fasical and muscle tightness in her shoulders for improved functional mobility needed for daily tasks.    Time 8   Period Weeks   Status On-going               Plan - 01/22/16 1602    Clinical Impression Statement A: AA/ROM exercises completed in standing this session, consistent verbal and tactile cuing required for correct form and technique. Pt completed scapular A/ROM exercises, max facilitation for scapular mobility. Added PVC pipe slide, pt enjoyed this exercise and completed with min faciiltation and consistent verbal cuing.    Rehab Potential Good   OT Frequency 2x / week   OT Duration 4 weeks   OT Treatment/Interventions Self-care/ADL training;Cryotherapy;Ultrasound;Moist Heat;Electrical Stimulation;DME and/or AE instruction;Neuromuscular education;Therapeutic exercise;Manual Therapy;Passive range of motion;Therapeutic exercises;Therapeutic activities;Patient/family education   Plan P: continue to work on improving form and independence during exercises. Focus on improved scapular mobility    Consulted and Agree with Plan of Care  Patient      Patient will benefit from skilled therapeutic intervention in order to improve the following deficits and impairments:  Decreased range of motion, Decreased strength, Increased fascial restricitons, Increased muscle spasms, Impaired flexibility, Pain  Visit Diagnosis: Pain in left shoulder  Other symptoms and signs involving the musculoskeletal system  Stiffness of left shoulder, not elsewhere classified  Problem List There are no active problems to display for this patient.   Emily Nichols, OTR/L  [phone]  01/22/2016, 4:05 PM  [place], [place], [address] Phone: [phone]   Fax:  [phone]  Name: Emily Nichols MRN: [id number] Date of Birth: November 09, 1940

## 2016-01-29 ENCOUNTER — Encounter (HOSPITAL_COMMUNITY): Payer: Self-pay

## 2016-01-29 ENCOUNTER — Ambulatory Visit (HOSPITAL_COMMUNITY): Payer: Medicare Other

## 2016-01-29 DIAGNOSIS — M25611 Stiffness of right shoulder, not elsewhere classified: Secondary | ICD-10-CM

## 2016-01-29 DIAGNOSIS — R29898 Other symptoms and signs involving the musculoskeletal system: Secondary | ICD-10-CM | POA: Diagnosis not present

## 2016-01-29 DIAGNOSIS — M25612 Stiffness of left shoulder, not elsewhere classified: Secondary | ICD-10-CM | POA: Diagnosis not present

## 2016-01-29 DIAGNOSIS — M25512 Pain in left shoulder: Secondary | ICD-10-CM | POA: Diagnosis not present

## 2016-01-29 NOTE — Therapy (Signed)
[place], [place], [address] Phone: [phone]   Fax:  [phone]  Occupational Therapy Treatment And reassessment Patient Details  Name: Emily Nichols MRN: [id number] Date of Birth: 1941/04/01 Referring Provider: Dr. Sanjuana Kava  Encounter Date: 01/29/2016      OT End of Session - 01/29/16 1631    Visit Number 9   Number of Visits 16   Authorization Type UHC Medicare and Medicaid   Authorization Time Period before 15th visit   Authorization - Visit Number 9   Authorization - Number of Visits 15   OT Start Time 1515  reassessment   OT Stop Time 1600   OT Time Calculation (min) 45 min   Activity Tolerance Patient tolerated treatment well   Behavior During Therapy [place] [facility] for tasks assessed/performed      Past Medical History  Diagnosis Date  . Seasonal allergies   . COPD (chronic obstructive pulmonary disease) [facility])     Past Surgical History  Procedure Laterality Date  . No past surgeries    . Colonoscopy N/A 04/07/2014    Procedure: COLONOSCOPY;  Surgeon: Danie Binder, MD;  [facility];  Service: Endoscopy;  Laterality: N/A;  8:30 AM    There were no vitals filed for this visit.      Subjective Assessment - 01/29/16 1626    Subjective  S: I haven't done my exercises recently but I am because I know I need to.    Special Tests FOTO score: 63/100   Currently in Pain? No/denies            [facility] OT Assessment - 01/29/16 1518    Assessment   Diagnosis Bilateral Shoulder Pain   Precautions   Precautions None   AROM   Overall AROM Comments assessed in seated, external rotation and internal rotation with shoulder adducted,    AROM Assessment Site Shoulder   Right/Left Shoulder Left;Right   Right Shoulder Flexion 106 Degrees  previous: 106   Right Shoulder ABduction 90 Degrees  previous: 82   Right Shoulder Internal Rotation 90 Degrees  previous: same   Right Shoulder  External Rotation 90 Degrees  previous: 75   Left Shoulder Flexion 75 Degrees  previous: 80   Left Shoulder ABduction 73 Degrees  previous: 78   Left Shoulder Internal Rotation 90 Degrees  previous: 90   Left Shoulder External Rotation 90 Degrees  previous: 75   Strength   Overall Strength Comments Assessed seated. IR/er adducted   Strength Assessment Site Shoulder   Right/Left Shoulder Right;Left   Right Shoulder Flexion 3+/5  previous: 3+/5   Right Shoulder ABduction 4-/5  previous: 3+/5   Right Shoulder Internal Rotation 4+/5  previous: 3+/5   Right Shoulder External Rotation 4+/5  previous: 3+/5   Left Shoulder Flexion 3-/5  previous: 3+/5   Left Shoulder ABduction 3-/5  previous: 3+/5   Left Shoulder Internal Rotation 4+/5  previous: 3+/5   Left Shoulder External Rotation 4-/5  previous: 3+/5                  OT Treatments/Exercises (OP) - 01/29/16 1628    Exercises   Exercises Shoulder   Shoulder Exercises: Supine   Protraction PROM;5 reps;Both   Horizontal ABduction PROM;5 reps;Both   External Rotation PROM;5 reps;Both   Internal Rotation PROM;5 reps;Both   Flexion PROM;5 reps;Both   ABduction PROM;5 reps;Both   Manual Therapy   Manual Therapy Myofascial release  Manual therapy comments manual therapy completed seperately from all other treatment interventions this date   Myofascial Release Myofascial release to left upper arm, scapular, and shoulder regions to decrease pain and restrictions and improve pain free mobility in bilateral shoulders                 OT Education - 01/29/16 1630    Education provided Yes   Education Details Reviewed HEP and recommended that patient continue to complete table slides and AA/ROM exercises at home at least 2 times a week.    Person(s) Educated Patient   Methods Explanation   Comprehension Verbalized understanding          OT Short Term Goals - 01/29/16 1550    OT SHORT TERM GOAL #1   Title  Patient will be educated on a HEP for improved A/ROM in bilateral shoulders for improved independence with dressing tasks.   Time 4   Period Weeks   Status Achieved   OT SHORT TERM GOAL #2   Title Patient will improve bilateral shoulder A/ROM by 20 degrees for improved ability to don and doff shirts.    Time 4   Period Weeks   Status Achieved   OT SHORT TERM GOAL #3   Title Patient will improve bilateral shoulder strength to 4-/5 for increased ability to lift pots and pans when cooking.    Time 4   Period Weeks   Status Partially Met   OT SHORT TERM GOAL #4   Title Patient will decrease pain in bilateral shoulders to 2/10 with functional tasks.   Time 4   Period Weeks   OT SHORT TERM GOAL #5   Title Patient will decrease fascial restrictions to min-mod in her shoulders in order to have increased mobility with functional tasks    Time 4   Period Weeks           OT Long Term Goals - 01/29/16 1550    OT LONG TERM GOAL #1   Title Patient will return to prior level of independence with all B/IADLS and leisure tasks, using right hand as dominant.    Time 8   Period Weeks   Status Achieved   OT LONG TERM GOAL #2   Title Patient will improve bilateral shoulder A/ROM to [facility] for improved independence placing dishes into overhead cabinets.   Time 8   Period Weeks   Status Achieved   OT LONG TERM GOAL #3   Title Patient will improve bilateral shoulder strength to 4/5 for increased ability to carry shopping bags.    Time 8   Period Weeks   Status Not Met   OT LONG TERM GOAL #4   Title Patient will decrease pain in her shoulders to 1/10 or better when completing daily tasks.   Time 8   Period Weeks   Status Achieved   OT LONG TERM GOAL #5   Title Patient will have min fasical and muscle tightness in her shoulders for improved functional mobility needed for daily tasks.    Time 8   Period Weeks   Status Achieved               Plan - 01/29/16 1633    Clinical  Impression Statement A: Reassessment completed this date. Pt met 4/5 STGs and 4/5 LTGs. Only goals not met were related to strength. Patient has improved overall since beginning therapy. Her ROM is functional as she reports that she is able to reach a  shef that is shoulder height. Strength is difficult with BUEs away from body. When she is able to keep her arms close to her she has no difficulty with strength. Pt is in agreement with discharge.    Plan P: D/C from therapy and continue HEP at home independently.       Patient will benefit from skilled therapeutic intervention in order to improve the following deficits and impairments:  Decreased range of motion, Decreased strength, Increased fascial restricitons, Increased muscle spasms, Impaired flexibility, Pain  Visit Diagnosis: Other symptoms and signs involving the musculoskeletal system  Stiffness of left shoulder, not elsewhere classified  Stiffness of right shoulder, not elsewhere classified      G-Codes - 2016-02-27 1653    Functional Assessment Tool Used FOTO score: 63/100 (37% impaired)   Functional Limitation Self care   Self Care Goal Status (U6539) At least 1 percent but less than 20 percent impaired, limited or restricted   Self Care Discharge Status [phone]) At least 20 percent but less than 40 percent impaired, limited or restricted     [place] SUMMARY  Visits from Start of Care: 9  Current functional level related to goals / functional outcomes: See above   Remaining deficits: See above   Education / Equipment: See above Plan: Patient agrees to discharge.  Patient goals were partially met. Patient is being discharged due to being pleased with the current functional level.  ?????       Problem List There are no active problems to display for this patient.   Ailene Ravel, OTR/L,CBIS  [phone]  2016-02-27, 5:01 PM  [place] [address] [place], [place], [address] Phone: [phone]   Fax:  [phone]  Name: Emily Nichols MRN: [id number] Date of Birth: 1940/11/14

## 2016-02-05 ENCOUNTER — Encounter (HOSPITAL_COMMUNITY): Payer: Medicare Other | Admitting: Occupational Therapy

## 2016-02-12 ENCOUNTER — Encounter (HOSPITAL_COMMUNITY): Payer: Medicare Other

## 2016-02-20 ENCOUNTER — Telehealth: Payer: Self-pay | Admitting: Orthopaedic Surgery

## 2016-02-20 NOTE — Telephone Encounter (Signed)
Rx done. 

## 2016-03-04 ENCOUNTER — Encounter: Payer: Self-pay | Admitting: Orthopaedic Surgery

## 2016-03-04 ENCOUNTER — Ambulatory Visit (INDEPENDENT_AMBULATORY_CARE_PROVIDER_SITE_OTHER): Payer: Medicare Other | Admitting: Orthopaedic Surgery

## 2016-03-04 VITALS — BP 104/69 | HR 84 | Temp 97.7°F | Ht 63.5 in | Wt 144.8 lb

## 2016-03-04 DIAGNOSIS — M25512 Pain in left shoulder: Secondary | ICD-10-CM | POA: Diagnosis not present

## 2016-03-04 MED ORDER — ACETAMINOPHEN-CODEINE #3 300-30 MG PO TABS: ORAL_TABLET | ORAL | Status: DC

## 2016-03-04 NOTE — Progress Notes (Signed)
Patient Emily Nichols EVALIE RIDENHOUR, female DOB:[birth year], 75 y.o. [place]:[id number]  Chief Complaint  Patient presents with  . Follow-up    Left shoulder    HPI  Emily Nichols is a 75 y.o. female who has pain of the left shoulder.  She has been doing her exercises and is improved.  She has lack of full motion still but is better.  She has no new trauma.  She has no paresthesias.    HPI  Body mass index is 25.24 kg/(m^2).  ROS  Review of Systems  Constitutional:       She does smoke.  HENT: Negative for congestion.   Respiratory: Positive for shortness of breath. Negative for cough.   Cardiovascular: Negative for chest pain and leg swelling.  Endocrine: Positive for cold intolerance.  Musculoskeletal: Positive for myalgias, joint swelling and arthralgias.  Allergic/Immunologic: Positive for environmental allergies.    Past Medical History  Diagnosis Date  . Seasonal allergies   . COPD (chronic obstructive pulmonary disease) [facility])     Past Surgical History  Procedure Laterality Date  . No past surgeries    . Colonoscopy N/A 04/07/2014    Procedure: COLONOSCOPY;  Surgeon: Danie Binder, MD;  Location: [facility];  Service: Endoscopy;  Laterality: N/A;  8:30 AM    Family History  Problem Relation Age of Onset  . Colon cancer Neg Hx     Social History Social History  Substance Use Topics  . Smoking status: Current Some Day Smoker -- 10 years    Types: Cigarettes  . Smokeless tobacco: None     Comment: "Sometimes I just light up one and smoke it."  . Alcohol Use: No    No Known Allergies  Current Outpatient Prescriptions  Medication Sig Dispense Refill  . ADVAIR DISKUS 250-50 MCG/DOSE AEPB INHALE ONE PUFF BY MOUTH TWICE DAILY  3  . albuterol (PROVENTIL HFA;VENTOLIN HFA) 108 (90 BASE) MCG/ACT inhaler Inhale 1 puff into the lungs every 6 (six) hours as needed for wheezing or shortness of breath. Reported on 11/21/2015    . fluticasone (FLONASE) 50 MCG/ACT nasal spray  Place into both nostrils daily.    . Fluticasone-Salmeterol (ADVAIR) 100-50 MCG/DOSE AEPB Inhale 1 puff into the lungs 2 (two) times daily.    Marland Kitchen loratadine (CLARITIN) 10 MG tablet Take 10 mg by mouth daily. Reported on 11/28/2015    . montelukast (SINGULAIR) 10 MG tablet Take 10 mg by mouth at bedtime. Reported on 11/28/2015    . Sod Picosulfate-Mag Ox-Cit Acd 10-3.5-12 MG-GM-GM PACK Take 1 Container by mouth as directed. 1 each 0  . tiotropium (SPIRIVA) 18 MCG inhalation capsule Place 18 mcg into inhaler and inhale daily. Reported on 11/28/2015    . acetaminophen-codeine (TYLENOL #3) 300-30 MG tablet One tablet every four hours as needed for pain.  Must last TEN days. 40 tablet 2   No current facility-administered medications for this visit.     Physical Exam  Blood pressure 104/69, pulse 84, temperature 97.7 F (36.5 C), height 5' 3.5" (1.613 m), weight 144 lb 12.8 oz (65.681 kg).  Constitutional: overall normal hygiene, normal nutrition, well developed, normal grooming, normal body habitus. Assistive device:none  Musculoskeletal: gait and station Limp none, muscle tone and strength are normal, no tremors or atrophy is present.  .  Neurological: coordination overall normal.  Deep tendon reflex/nerve stretch intact.  Sensation normal.  Cranial nerves II-XII intact.   Skin:   normal overall no scars, lesions, ulcers or rashes. No  psoriasis.  Psychiatric: Alert and oriented x 3.  Recent memory intact, remote memory unclear.  Normal mood and affect. Well groomed.  Good eye contact.  Cardiovascular: overall no swelling, no varicosities, no edema bilaterally, normal temperatures of the legs and arms, no clubbing, cyanosis and good capillary refill.  Lymphatic: palpation is normal.  Examination of left Upper Extremity is done.  Inspection:   Overall:  Elbow non-tender without crepitus or defects, forearm non-tender without crepitus or defects, wrist non-tender without crepitus or defects,  hand non-tender.    Shoulder: with glenohumeral joint tenderness, without effusion.   Upper arm: without swelling and tenderness   Range of motion:   Overall:  Full range of motion of the elbow, full range of motion of wrist and full range of motion in fingers.   Shoulder:  left  145 degrees forward flexion; 100 degrees abduction; 35 degrees internal rotation, 35 degrees external rotation, 20 degrees extension, 40 degrees adduction.   Stability:   Overall:  Shoulder, elbow and wrist stable   Strength and Tone:   Overall full shoulder muscles strength, full upper arm strength and normal upper arm bulk and tone.   The patient has been educated about the nature of the problem(s) and counseled on treatment options.  The patient appeared to understand what I have discussed and is in agreement with it.  Encounter Diagnosis  Name Primary?  . Left shoulder pain Yes    PLAN Call if any problems.  Precautions discussed.  Continue current medications.   Return to clinic 2 months   Electronically Signed Sanjuana Kava, MD 7/18/201710:18 PM

## 2016-03-28 DIAGNOSIS — L209 Atopic dermatitis, unspecified: Secondary | ICD-10-CM | POA: Diagnosis not present

## 2016-03-28 DIAGNOSIS — J4520 Mild intermittent asthma, uncomplicated: Secondary | ICD-10-CM | POA: Diagnosis not present

## 2016-03-28 DIAGNOSIS — J309 Allergic rhinitis, unspecified: Secondary | ICD-10-CM | POA: Diagnosis not present

## 2016-04-04 ENCOUNTER — Other Ambulatory Visit (HOSPITAL_COMMUNITY): Payer: Self-pay | Admitting: Internal Medicine

## 2016-04-04 DIAGNOSIS — Z1231 Encounter for screening mammogram for malignant neoplasm of breast: Secondary | ICD-10-CM

## 2016-04-28 ENCOUNTER — Ambulatory Visit (HOSPITAL_COMMUNITY)
Admission: RE | Admit: 2016-04-28 | Discharge: 2016-04-28 | Disposition: A | Discharge status: Home / Self Care | Payer: Medicare Other | Source: Ambulatory Visit | Attending: Internal Medicine | Admitting: Internal Medicine

## 2016-04-28 DIAGNOSIS — Z1231 Encounter for screening mammogram for malignant neoplasm of breast: Secondary | ICD-10-CM | POA: Insufficient documentation

## 2016-05-06 ENCOUNTER — Encounter: Payer: Self-pay | Admitting: Orthopaedic Surgery

## 2016-05-06 ENCOUNTER — Ambulatory Visit (INDEPENDENT_AMBULATORY_CARE_PROVIDER_SITE_OTHER): Payer: Medicare Other | Admitting: Orthopaedic Surgery

## 2016-05-06 VITALS — BP 134/81 | HR 89 | Temp 97.3°F | Ht 66.0 in | Wt 146.0 lb

## 2016-05-06 DIAGNOSIS — M25512 Pain in left shoulder: Secondary | ICD-10-CM | POA: Diagnosis not present

## 2016-05-06 NOTE — Progress Notes (Signed)
Patient Emily Nichols, female DOB:02/26/1941, 75 y.o. OZ:[id number]  Chief Complaint  Patient presents with  . Follow-up    left shoulder pain    HPI  Emily Nichols is a 75 y.o. female who has had left shoulder pain.  She is much better now. She has been doing her exercises and taking her medicine. She has no paresthesias.  She has no trauma. HPI  Body mass index is 23.57 kg/m.  ROS  Review of Systems  Constitutional:       She does smoke.  HENT: Negative for congestion.   Respiratory: Positive for shortness of breath. Negative for cough.   Cardiovascular: Negative for chest pain and leg swelling.  Endocrine: Positive for cold intolerance.  Musculoskeletal: Positive for arthralgias, joint swelling and myalgias.  Allergic/Immunologic: Positive for environmental allergies.    Past Medical History:  Diagnosis Date  . COPD (chronic obstructive pulmonary disease) ([place])   . Seasonal allergies     Past Surgical History:  Procedure Laterality Date  . COLONOSCOPY N/A 04/07/2014   Procedure: COLONOSCOPY;  Surgeon: Danie Binder, MD;  Location: [facility];  Service: Endoscopy;  Laterality: N/A;  8:30 AM  . NO PAST SURGERIES      Family History  Problem Relation Age of Onset  . Colon cancer Neg Hx     Social History Social History  Substance Use Topics  . Smoking status: Current Some Day Smoker    Years: 10.00    Types: Cigarettes  . Smokeless tobacco: Not on file     Comment: "Sometimes I just light up one and smoke it."  . Alcohol use No    No Known Allergies  Current Outpatient Prescriptions  Medication Sig Dispense Refill  . acetaminophen-codeine (TYLENOL #3) 300-30 MG tablet One tablet every four hours as needed for pain.  Must last TEN days. 40 tablet 2  . ADVAIR DISKUS 250-50 MCG/DOSE AEPB INHALE ONE PUFF BY MOUTH TWICE DAILY  3  . albuterol (PROVENTIL HFA;VENTOLIN HFA) 108 (90 BASE) MCG/ACT inhaler Inhale 1 puff into the lungs every 6 (six) hours  as needed for wheezing or shortness of breath. Reported on 11/21/2015    . fluticasone (FLONASE) 50 MCG/ACT nasal spray Place into both nostrils daily.    . Fluticasone-Salmeterol (ADVAIR) 100-50 MCG/DOSE AEPB Inhale 1 puff into the lungs 2 (two) times daily.    Marland Kitchen loratadine (CLARITIN) 10 MG tablet Take 10 mg by mouth daily. Reported on 11/28/2015    . montelukast (SINGULAIR) 10 MG tablet Take 10 mg by mouth at bedtime. Reported on 11/28/2015    . Sod Picosulfate-Mag Ox-Cit Acd 10-3.5-12 MG-GM-GM PACK Take 1 Container by mouth as directed. 1 each 0  . tiotropium (SPIRIVA) 18 MCG inhalation capsule Place 18 mcg into inhaler and inhale daily. Reported on 11/28/2015     No current facility-administered medications for this visit.      Physical Exam  Blood pressure 134/81, pulse 89, temperature 97.3 F (36.3 C), height 5\' 6"  (1.676 m), weight 146 lb (66.2 kg).  Constitutional: overall normal hygiene, normal nutrition, well developed, normal grooming, normal body habitus. Assistive device:none  Musculoskeletal: gait and station Limp none, muscle tone and strength are normal, no tremors or atrophy is present.  .  Neurological: coordination overall normal.  Deep tendon reflex/nerve stretch intact.  Sensation normal.  Cranial nerves II-XII intact.   Skin:   Normal overall no scars, lesions, ulcers or rashes. No psoriasis.  Psychiatric: Alert and oriented x 3.  Recent memory intact, remote memory unclear.  Normal mood and affect. Well groomed.  Good eye contact.  Cardiovascular: overall no swelling, no varicosities, no edema bilaterally, normal temperatures of the legs and arms, no clubbing, cyanosis and good capillary refill.  Lymphatic: palpation is normal.  Examination of left Upper Extremity is done.  Inspection:   Overall:  Elbow non-tender without crepitus or defects, forearm non-tender without crepitus or defects, wrist non-tender without crepitus or defects, hand non-tender.    Shoulder:  without glenohumeral joint tenderness, without effusion.   Upper arm: without swelling and tenderness   Range of motion:   Overall:  Full range of motion of the elbow, full range of motion of wrist and full range of motion in fingers.   Shoulder:  left  180 degrees forward flexion; 16 degrees abduction; 40 degrees internal rotation, 40 degrees external rotation, 20 degrees extension, 40 degrees adduction.   Stability:   Overall:  Shoulder, elbow and wrist stable   Strength and Tone:   Overall full shoulder muscles strength, full upper arm strength and normal upper arm bulk and tone.   The patient has been educated about the nature of the problem(s) and counseled on treatment options.  The patient appeared to understand what I have discussed and is in agreement with it.  Encounter Diagnosis  Name Primary?  . Left shoulder pain Yes    PLAN Call if any problems.  Precautions discussed.  Continue current medications.   Return to clinic PRN   Electronically Signed Sanjuana Kava, MD 9/19/20173:05 PM

## 2016-07-14 ENCOUNTER — Telehealth: Payer: Self-pay | Admitting: Orthopaedic Surgery

## 2016-10-22 DIAGNOSIS — J309 Allergic rhinitis, unspecified: Secondary | ICD-10-CM | POA: Diagnosis not present

## 2016-10-22 DIAGNOSIS — J45998 Other asthma: Secondary | ICD-10-CM | POA: Diagnosis not present

## 2016-10-22 DIAGNOSIS — Z79899 Other long term (current) drug therapy: Secondary | ICD-10-CM | POA: Diagnosis not present

## 2016-10-22 DIAGNOSIS — M1990 Unspecified osteoarthritis, unspecified site: Secondary | ICD-10-CM | POA: Diagnosis not present

## 2016-10-22 DIAGNOSIS — J329 Chronic sinusitis, unspecified: Secondary | ICD-10-CM | POA: Diagnosis not present

## 2016-10-22 DIAGNOSIS — J4520 Mild intermittent asthma, uncomplicated: Secondary | ICD-10-CM | POA: Diagnosis not present

## 2017-01-22 DIAGNOSIS — J309 Allergic rhinitis, unspecified: Secondary | ICD-10-CM | POA: Diagnosis not present

## 2017-01-22 DIAGNOSIS — J4520 Mild intermittent asthma, uncomplicated: Secondary | ICD-10-CM | POA: Diagnosis not present

## 2017-01-22 DIAGNOSIS — M1990 Unspecified osteoarthritis, unspecified site: Secondary | ICD-10-CM | POA: Diagnosis not present

## 2017-03-19 ENCOUNTER — Other Ambulatory Visit (HOSPITAL_COMMUNITY): Payer: Self-pay | Admitting: Internal Medicine

## 2017-03-19 DIAGNOSIS — Z1231 Encounter for screening mammogram for malignant neoplasm of breast: Secondary | ICD-10-CM

## 2017-03-31 ENCOUNTER — Encounter: Payer: Self-pay | Admitting: Gastroenterology

## 2017-04-23 DIAGNOSIS — J309 Allergic rhinitis, unspecified: Secondary | ICD-10-CM | POA: Diagnosis not present

## 2017-04-23 DIAGNOSIS — J4520 Mild intermittent asthma, uncomplicated: Secondary | ICD-10-CM | POA: Diagnosis not present

## 2017-04-23 DIAGNOSIS — J329 Chronic sinusitis, unspecified: Secondary | ICD-10-CM | POA: Diagnosis not present

## 2017-04-30 ENCOUNTER — Ambulatory Visit (HOSPITAL_COMMUNITY)
Admission: RE | Admit: 2017-04-30 | Discharge: 2017-04-30 | Disposition: A | Discharge status: Home / Self Care | Payer: Medicare Other | Source: Ambulatory Visit | Attending: Internal Medicine | Admitting: Internal Medicine

## 2017-04-30 DIAGNOSIS — Z1231 Encounter for screening mammogram for malignant neoplasm of breast: Secondary | ICD-10-CM | POA: Diagnosis not present

## 2017-05-15 DIAGNOSIS — L989 Disorder of the skin and subcutaneous tissue, unspecified: Secondary | ICD-10-CM | POA: Diagnosis not present

## 2017-05-15 DIAGNOSIS — J4520 Mild intermittent asthma, uncomplicated: Secondary | ICD-10-CM | POA: Diagnosis not present

## 2017-05-15 DIAGNOSIS — J329 Chronic sinusitis, unspecified: Secondary | ICD-10-CM | POA: Diagnosis not present

## 2017-05-15 DIAGNOSIS — M1990 Unspecified osteoarthritis, unspecified site: Secondary | ICD-10-CM | POA: Diagnosis not present

## 2017-06-02 ENCOUNTER — Ambulatory Visit: Payer: Medicare Other | Admitting: Nurse Practitioner

## 2017-07-16 ENCOUNTER — Ambulatory Visit: Payer: Medicare Other | Admitting: Nurse Practitioner

## 2017-07-23 DIAGNOSIS — J4520 Mild intermittent asthma, uncomplicated: Secondary | ICD-10-CM | POA: Diagnosis not present

## 2017-07-23 DIAGNOSIS — M1990 Unspecified osteoarthritis, unspecified site: Secondary | ICD-10-CM | POA: Diagnosis not present

## 2017-07-23 DIAGNOSIS — J309 Allergic rhinitis, unspecified: Secondary | ICD-10-CM | POA: Diagnosis not present

## 2017-07-23 DIAGNOSIS — J329 Chronic sinusitis, unspecified: Secondary | ICD-10-CM | POA: Diagnosis not present

## 2017-09-03 ENCOUNTER — Ambulatory Visit (INDEPENDENT_AMBULATORY_CARE_PROVIDER_SITE_OTHER): Payer: Medicare Other | Admitting: Nurse Practitioner

## 2017-09-03 ENCOUNTER — Encounter: Payer: Self-pay | Admitting: *Deleted

## 2017-09-03 ENCOUNTER — Other Ambulatory Visit: Payer: Self-pay | Admitting: *Deleted

## 2017-09-03 ENCOUNTER — Encounter: Payer: Self-pay | Admitting: Nurse Practitioner

## 2017-09-03 DIAGNOSIS — Z860101 Personal history of adenomatous and serrated colon polyps: Secondary | ICD-10-CM | POA: Insufficient documentation

## 2017-09-03 DIAGNOSIS — Z86010 Personal history of colonic polyps: Secondary | ICD-10-CM | POA: Diagnosis not present

## 2017-09-03 DIAGNOSIS — R69 Illness, unspecified: Secondary | ICD-10-CM | POA: Diagnosis not present

## 2017-09-03 MED ORDER — PEG 3350-KCL-NA BICARB-NACL 420 G PO SOLR: 4000.0000 mL | Freq: Once | ORAL | 0 refills | Status: AC

## 2017-09-03 NOTE — Progress Notes (Signed)
CC'D TO PCP °

## 2017-09-03 NOTE — Assessment & Plan Note (Signed)
Patient had a colonoscopy 3 years ago which found approximately 14 colon polyps which were a mix of hyperplastic, sessile serrated adenoma, and tubular adenoma.  Recommended repeat exam in 3 years.  At this point she is generally asymptomatic from a GI standpoint.  Denies any red flag/warning signs or symptoms such as rectal bleeding, change in bowel habits, unintentional weight loss.  We will proceed with surveillance colonoscopy at this time.  Proceed with colonoscopy with Dr. Oneida Alar in the near future. The risks, benefits, and alternatives have been discussed in detail with the patient. They state understanding and desire to proceed.   The patient is not on any anticoagulants, anxiolytics, chronic pain medications, or antidepressants.  Conscious sedation should be adequate for her procedure.

## 2017-09-03 NOTE — Assessment & Plan Note (Signed)
The patient's medication list included Tylenol with codeine.  However, she states she is not currently taking this.  She is on a couple different inhalers for chronic history of COPD.  After evaluating her pulmonary status, she feels she is at baseline related to her breathing.  Her lungs are clear.  At this point she appears to be acceptable risk to proceed with colonoscopy.

## 2017-09-03 NOTE — Patient Instructions (Signed)
1. We will schedule your procedure for you. 2. Further recommendations will be made after your colonoscopy. 3. Return for follow-up based on recommendations made after your colonoscopy. 4. Call us if you have any questions or concerns.

## 2017-09-03 NOTE — Progress Notes (Addendum)
REVIEWED-NO ADDITIONAL RECOMMENDATIONS.  Primary Care Physician:  Rosita Fire, MD Primary Gastroenterologist:  Dr. Oneida Alar  Chief Complaint  Patient presents with  . Colonoscopy    consult    HPI:   Emily Nichols is a 77 y.o. female who presents for 3-year recall colonoscopy.  The patient has not been seen by our office in over 3 years.  Last colonoscopy completed 04/07/2014, for average risk screening, which found a total of 14 polyps removed, left colon redundant.  Recommended high-fiber diet, avoid bloating trigger foods.  Surgical pathology found the polyps to be a mix of hyperplastic, sessile serrated adenoma, and tubular adenoma.  Recommended repeat exam in 3 years.  She was brought into the office because of possible need for augmented sedation given chronic medications.  Today she states she's doing well overall. Denies abdominal pain, N/V, hematochezia, melena, fever, chills, unintentional weight loss, acute changes in bowel habits. Denies chest pain, dyspnea, dizziness, lightheadedness, syncope, near syncope. Denies any other upper or lower GI symptoms.  States she was previously on Tylenol with Codeine during therapy but is no longer taking it.  Past Medical History:  Diagnosis Date  . COPD (chronic obstructive pulmonary disease) ([place])   . Seasonal allergies     Past Surgical History:  Procedure Laterality Date  . COLONOSCOPY N/A 04/07/2014   Procedure: COLONOSCOPY;  Surgeon: Danie Binder, MD;  [facility];  Service: Endoscopy;  Laterality: N/A;  8:30 AM  . NO PAST SURGERIES      Current Outpatient Medications  Medication Sig Dispense Refill  . ADVAIR DISKUS 250-50 MCG/DOSE AEPB INHALE ONE PUFF BY MOUTH TWICE DAILY  3  . albuterol (PROVENTIL HFA;VENTOLIN HFA) 108 (90 BASE) MCG/ACT inhaler Inhale 1 puff into the lungs every 6 (six) hours as needed for wheezing or shortness of breath. Reported on 11/21/2015    . fluticasone (FLONASE) 50 MCG/ACT nasal  spray Place into both nostrils daily.    . Fluticasone-Salmeterol (ADVAIR) 100-50 MCG/DOSE AEPB Inhale 1 puff into the lungs 2 (two) times daily.    Marland Kitchen loratadine (CLARITIN) 10 MG tablet Take 10 mg by mouth daily. Reported on 11/28/2015    . montelukast (SINGULAIR) 10 MG tablet Take 10 mg by mouth at bedtime. Reported on 11/28/2015    . tiotropium (SPIRIVA) 18 MCG inhalation capsule Place 18 mcg into inhaler and inhale daily. Reported on 11/28/2015    . Sod Picosulfate-Mag Ox-Cit Acd 10-3.5-12 MG-GM-GM PACK Take 1 Container by mouth as directed. (Patient not taking: Reported on 09/03/2017) 1 each 0   No current facility-administered medications for this visit.     Allergies as of 09/03/2017  . (No Known Allergies)    Family History  Problem Relation Age of Onset  . Colon cancer Neg Hx     Social History   Socioeconomic History  . Marital status: Single    Spouse name: Not on file  . Number of children: Not on file  . Years of education: Not on file  . Highest education level: Not on file  Social Needs  . Financial resource strain: Not on file  . Food insecurity - worry: Not on file  . Food insecurity - inability: Not on file  . Transportation needs - medical: Not on file  . Transportation needs - non-medical: Not on file  Occupational History  . Not on file  Tobacco Use  . Smoking status: Current Some Day Smoker    Years: 10.00    Types: Cigarettes  .  Smokeless tobacco: Never Used  . Tobacco comment: "Sometimes I just light up one and smoke it."  Substance and Sexual Activity  . Alcohol use: No  . Drug use: No  . Sexual activity: Not on file  Other Topics Concern  . Not on file  Social History Narrative  . Not on file    Review of Systems: Complete ROS negative except as per HPI.    Physical Exam: BP 137/81   Pulse 91   Temp (!) 95.6 F (35.3 C) (Axillary)   Ht 5\' 6"  (1.676 m)   Wt 151 lb 6.4 oz (68.7 kg)   BMI 24.44 kg/m  General:   Alert and oriented.  Pleasant and cooperative. Well-nourished and well-developed.  Eyes:  Without icterus, sclera clear and conjunctiva pink.  Ears:  Normal auditory acuity. Cardiovascular:  S1, S2 present without murmurs appreciated. Extremities without clubbing or edema. Respiratory:  Clear to auscultation bilaterally. No wheezes, rales, or rhonchi. No distress.  Gastrointestinal:  +BS, soft, non-tender and non-distended. No HSM noted. No guarding or rebound. No masses appreciated.  Rectal:  Deferred  Musculoskalatal:  Symmetrical without gross deformities. Neurologic:  Alert and oriented x4;  grossly normal neurologically. Psych:  Alert and cooperative. Normal mood and affect. Heme/Lymph/Immune: No excessive bruising noted.    09/03/2017 9:11 AM   Disclaimer: This note was dictated with voice recognition software. Similar sounding words can inadvertently be transcribed and may not be corrected upon review.

## 2017-09-18 ENCOUNTER — Encounter (HOSPITAL_COMMUNITY)
Admission: RE | Disposition: A | Discharge status: Home / Self Care | Payer: Self-pay | Source: Ambulatory Visit | Attending: Gastroenterology

## 2017-09-18 ENCOUNTER — Other Ambulatory Visit: Payer: Self-pay

## 2017-09-18 ENCOUNTER — Encounter (HOSPITAL_COMMUNITY): Payer: Self-pay | Admitting: *Deleted

## 2017-09-18 ENCOUNTER — Ambulatory Visit (HOSPITAL_COMMUNITY)
Admission: RE | Admit: 2017-09-18 | Discharge: 2017-09-18 | Disposition: A | Discharge status: Home / Self Care | Payer: Medicare Other | Source: Ambulatory Visit | Attending: Gastroenterology | Admitting: Gastroenterology

## 2017-09-18 DIAGNOSIS — Z7951 Long term (current) use of inhaled steroids: Secondary | ICD-10-CM | POA: Insufficient documentation

## 2017-09-18 DIAGNOSIS — K635 Polyp of colon: Secondary | ICD-10-CM | POA: Insufficient documentation

## 2017-09-18 DIAGNOSIS — J449 Chronic obstructive pulmonary disease, unspecified: Secondary | ICD-10-CM | POA: Insufficient documentation

## 2017-09-18 DIAGNOSIS — D123 Benign neoplasm of transverse colon: Secondary | ICD-10-CM | POA: Insufficient documentation

## 2017-09-18 DIAGNOSIS — K5520 Angiodysplasia of colon without hemorrhage: Secondary | ICD-10-CM | POA: Insufficient documentation

## 2017-09-18 DIAGNOSIS — F17210 Nicotine dependence, cigarettes, uncomplicated: Secondary | ICD-10-CM | POA: Diagnosis not present

## 2017-09-18 DIAGNOSIS — Z79899 Other long term (current) drug therapy: Secondary | ICD-10-CM | POA: Diagnosis not present

## 2017-09-18 DIAGNOSIS — Z1211 Encounter for screening for malignant neoplasm of colon: Secondary | ICD-10-CM | POA: Diagnosis not present

## 2017-09-18 DIAGNOSIS — D127 Benign neoplasm of rectosigmoid junction: Secondary | ICD-10-CM | POA: Diagnosis not present

## 2017-09-18 DIAGNOSIS — Z9889 Other specified postprocedural states: Secondary | ICD-10-CM | POA: Diagnosis not present

## 2017-09-18 DIAGNOSIS — Q438 Other specified congenital malformations of intestine: Secondary | ICD-10-CM | POA: Diagnosis not present

## 2017-09-18 DIAGNOSIS — Z86010 Personal history of colonic polyps: Secondary | ICD-10-CM | POA: Diagnosis not present

## 2017-09-18 HISTORY — DX: Polyp of colon: K63.5

## 2017-09-18 HISTORY — PX: COLONOSCOPY: SHX5424

## 2017-09-18 SURGERY — COLONOSCOPY
Anesthesia: Moderate Sedation

## 2017-09-18 MED ORDER — MEPERIDINE HCL 100 MG/ML IJ SOLN
INTRAMUSCULAR | Status: DC | PRN
  Administered 2017-09-18: 25 mg via INTRAVENOUS

## 2017-09-18 MED ORDER — MIDAZOLAM HCL 5 MG/5ML IJ SOLN
INTRAMUSCULAR | Status: DC | PRN
  Administered 2017-09-18 (×4): 1 mg via INTRAVENOUS

## 2017-09-18 MED ORDER — MEPERIDINE HCL 100 MG/ML IJ SOLN
INTRAMUSCULAR | Status: AC
  Filled 2017-09-18: qty 2

## 2017-09-18 MED ORDER — MIDAZOLAM HCL 5 MG/5ML IJ SOLN
INTRAMUSCULAR | Status: AC
  Filled 2017-09-18: qty 10

## 2017-09-18 MED ORDER — STERILE WATER FOR IRRIGATION IR SOLN
Status: DC | PRN
  Administered 2017-09-18: 100 mL

## 2017-09-18 MED ORDER — SODIUM CHLORIDE 0.9 % IV SOLN
INTRAVENOUS | Status: DC
  Administered 2017-09-18: 13:00:00 via INTRAVENOUS

## 2017-09-18 NOTE — Op Note (Signed)
[facility] Patient Name: Emily Nichols Procedure Date: 09/18/2017 1:28 PM MRN: [id number] Date of Birth: Apr 18, 1941 Attending MD: Barney Drain MD, MD CSN: [id number] Age: 77 Admit Type: Outpatient Procedure:                Colonoscopy WITH COLD FORCEPS/SNARE & SNARE CAUTERY                            POLYPECTOMY Indications:              Personal history of colonic polyps Providers:                Barney Drain MD, MD, Rosina Lowenstein, RN, Aram Candela Referring MD:             Rosita Fire MD, MD Medicines:                Meperidine 25 mg IV, Midazolam 4 mg IV Complications:            No immediate complications. Estimated Blood Loss:     Estimated blood loss was minimal. Procedure:                Pre-Anesthesia Assessment:                           - Prior to the procedure, a History and Physical                            was performed, and patient medications and                            allergies were reviewed. The patient's tolerance of                            previous anesthesia was also reviewed. The risks                            and benefits of the procedure and the sedation                            options and risks were discussed with the patient.                            All questions were answered, and informed consent                            was obtained. Prior Anticoagulants: The patient has                            taken no previous anticoagulant or antiplatelet                            agents. ASA Grade Assessment: II - A patient with                            mild systemic disease. After reviewing the risks  and benefits, the patient was deemed in                            satisfactory condition to undergo the procedure.                            After obtaining informed consent, the colonoscope                            was passed under direct vision. Throughout the                            procedure, the  patient's blood pressure, pulse, and                            oxygen saturations were monitored continuously. The                            EC-[id number] ([id number]) scope was introduced through                            the anus and advanced to the the cecum, identified                            by appendiceal orifice and ileocecal valve. The                            colonoscopy was technically difficult and complex                            due to poor endoscopic visualization, significant                            looping and the patient's agitation. Successful                            completion of the procedure was aided by increasing                            the dose of sedation medication, straightening and                            shortening the scope to obtain bowel loop                            reduction, lavage and COLOWRAP. The patient                            tolerated the procedure fairly well. The quality of                            the bowel preparation was adequate to identify  polyps. The ileocecal valve, appendiceal orifice,                            and rectum were photographed. Scope In: 1:56:14 PM Scope Out: 2:33:36 PM Scope Withdrawal Time: 0 hours 27 minutes 15 seconds  Total Procedure Duration: 0 hours 37 minutes 22 seconds  Findings:      Two sessile polyps were found in the hepatic flexure. The polyps were 3       to 4 mm in size. These polyps were removed with a cold snare. Resection       and retrieval were complete.      Two sessile polyps were found in the sigmoid colon and splenic flexure.       The polyps were 3 to 5 mm in size. These polyps were removed with a cold       biopsy forceps. Resection and retrieval were complete.      A 6 mm polyp was found in the splenic flexure. The polyp was sessile.       The polyp was removed with a hot snare. Resection and retrieval were       complete.      The recto-sigmoid  colon, sigmoid colon and descending colon were       significantly redundant.      A single medium-sized localized angioectasia without bleeding was found       in the cecum.      The retroflexed view of the distal rectum and anal verge was normal and       showed no anal or rectal abnormalities. Impression:               - Two 3 to 4 mm polyps at the hepatic flexure,                            removed with a cold snare. Resected and retrieved.                           - Two 3 to 5 mm polyps in the sigmoid colon and at                            the splenic flexure, removed with a cold snare.                            Resected and retrieved.                           - One 6 mm polyp at the splenic flexure, removed                            with a hot snare. Resected and retrieved.                           - Redundant colon.                           - A single non-bleeding colonic angioectasia.                           -  The distal rectum and anal verge are normal on                            retroflexion view. Moderate Sedation:      Moderate (conscious) sedation was administered by the endoscopy nurse       and supervised by the endoscopist. The following parameters were       monitored: oxygen saturation, heart rate, blood pressure, and response       to care. Total physician intraservice time was 47 minutes. Recommendation:           - High fiber diet.                           - Continue present medications.                           - Await pathology results.                           - Patient has a contact number available for                            emergencies. The signs and symptoms of potential                            delayed complications were discussed with the                            patient. Return to normal activities tomorrow.                            Written discharge instructions were provided to the                            patient.                            - No repeat colonoscopy due to age. Procedure Code(s):        --- Professional ---                           [id number], Colonoscopy, flexible; with removal of                            tumor(s), polyp(s), or other lesion(s) by snare                            technique                           45380, 59, Colonoscopy, flexible; with biopsy,                            single or multiple                           99152, Moderate  sedation services provided by the                            same physician or other qualified health care                            professional performing the diagnostic or                            therapeutic service that the sedation supports,                            requiring the presence of an independent trained                            observer to assist in the monitoring of the                            patient's level of consciousness and physiological                            status; initial 15 minutes of intraservice time,                            patient age 54 years or older                           [phone], Moderate sedation services; each additional                            15 minutes intraservice time                           99153, Moderate sedation services; each additional                            15 minutes intraservice time Diagnosis Code(s):        --- Professional ---                           D12.5, Benign neoplasm of sigmoid colon                           D12.3, Benign neoplasm of transverse colon (hepatic                            flexure or splenic flexure)                           K55.20, Angiodysplasia of colon without hemorrhage                           Z86.010, Personal history of colonic polyps                           Q43.8, Other specified  congenital malformations of                            intestine CPT copyright 2016 [employer]. All rights reserved. The codes documented in this  report are preliminary and upon coder review may  be revised to meet current compliance requirements. Barney Drain, MD Barney Drain MD, MD 09/18/2017 2:50:32 PM This report has been signed electronically. Number of Addenda: 0

## 2017-09-18 NOTE — H&P (Signed)
Primary Care Physician:  Rosita Fire, MD Primary Gastroenterologist:  Dr. Oneida Alar  Pre-Procedure History & Physical: HPI:  Emily Nichols is a 77 y.o. female here for PERSONAL HISTORY OF POLYPS.  Past Medical History:  Diagnosis Date  . Colon polyps   . COPD (chronic obstructive pulmonary disease) ([place])   . Seasonal allergies     Past Surgical History:  Procedure Laterality Date  . COLONOSCOPY N/A 04/07/2014   Procedure: COLONOSCOPY;  Surgeon: Danie Binder, MD;  Location: [facility];  Service: Endoscopy;  Laterality: N/A;  8:30 AM  . COLONOSCOPY      Prior to Admission medications   Medication Sig Start Date End Date Taking? Authorizing Provider  ADVAIR DISKUS 250-50 MCG/DOSE AEPB INHALE ONE PUFF BY MOUTH TWICE DAILY 11/19/15  Yes [provider]  Calcium Carbonate-Vitamin D (CALCIUM-VITAMIN D3 PO) Take 1 tablet by mouth daily.   Yes [provider]  fluticasone (FLONASE) 50 MCG/ACT nasal spray Place 2 sprays into both nostrils daily as needed for allergies.    Yes [provider]  loratadine (CLARITIN) 10 MG tablet Take 10 mg by mouth daily as needed for allergies.    Yes [provider]  montelukast (SINGULAIR) 10 MG tablet Take 10 mg by mouth daily.    Yes [provider]  polyethylene glycol-electrolytes (NULYTELY/GOLYTELY) 420 g solution Take 4,000 mLs by mouth once. 09/08/17  Yes [provider]  Sod Picosulfate-Mag Ox-Cit Acd 10-3.5-12 MG-GM-GM PACK Take 1 Container by mouth as directed. Patient not taking: Reported on 09/09/2017 03/20/14   Danie Binder, MD    Allergies as of 09/03/2017  . (No Known Allergies)    Family History  Problem Relation Age of Onset  . Colon cancer Neg Hx     Social History   Socioeconomic History  . Marital status: Single    Spouse name: Not on file  . Number of children: Not on file  . Years of education: Not on file  . Highest education level: Not on file  Social Needs  .  Financial resource strain: Not on file  . Food insecurity - worry: Not on file  . Food insecurity - inability: Not on file  . Transportation needs - medical: Not on file  . Transportation needs - non-medical: Not on file  Occupational History  . Not on file  Tobacco Use  . Smoking status: Current Some Day Smoker    Years: 10.00    Types: Cigarettes  . Smokeless tobacco: Never Used  . Tobacco comment: "Sometimes I just light up one and smoke it."  Substance and Sexual Activity  . Alcohol use: No  . Drug use: No  . Sexual activity: Not on file  Other Topics Concern  . Not on file  Social History Narrative  . Not on file    Review of Systems: See HPI, otherwise negative ROS   Physical Exam: BP 124/73   Pulse 73   Temp 97.7 F (36.5 C) (Oral)   Resp 12   Ht 5\' 6"  (1.676 m)   Wt 151 lb (68.5 kg)   SpO2 99%   BMI 24.37 kg/m  General:   Alert,  pleasant and cooperative in NAD Head:  Normocephalic and atraumatic. Neck:  Supple; Lungs:  Clear throughout to auscultation.    Heart:  Regular rate and rhythm. Abdomen:  Soft, nontender and nondistended. Normal bowel sounds, without guarding, and without rebound.   Neurologic:  Alert and  oriented x4;  grossly  normal neurologically.  Impression/Plan:     PERSONAL HISTORY OF POLYPS.  PLAN: 1. TCS TODAY DISCUSSED PROCEDURE, BENEFITS, & RISKS: < 1% chance of medication reaction, bleeding, perforation, or rupture of spleen/liver.

## 2017-09-18 NOTE — Discharge Instructions (Signed)
You had 5 small polyps removed. You have a redundant/floppy left colon.   DRINK WATER TO KEEP YOUR URINE LIGHT YELLOW.  FOLLOW A HIGH FIBER DIET. AVOID ITEMS THAT CAUSE BLOATING & GAS. SEE INFO BELOW.  YOUR BIOPSY RESULTS WILL BE AVAILABLE IN MY CHART AFTER FEB 6 AND MY OFFICE WILL CONTACT YOU IN 10-14 DAYS WITH YOUR RESULTS.   NO NEED FOR SCREENING COLONOSCOPY IN THE FUTURE.  Colonoscopy Care After Read the instructions outlined below and refer to this sheet in the next week. These discharge instructions provide you with general information on caring for yourself after you leave the hospital. While your treatment has been planned according to the most current medical practices available, unavoidable complications occasionally occur. If you have any problems or questions after discharge, call DR. Destry Dauber, [phone].  ACTIVITY  You may resume your regular activity, but move at a slower pace for the next 24 hours.   Take frequent rest periods for the next 24 hours.   Walking will help get rid of the air and reduce the bloated feeling in your belly (abdomen).   No driving for 24 hours (because of the medicine (anesthesia) used during the test).   You may shower.   Do not sign any important legal documents or operate any machinery for 24 hours (because of the anesthesia used during the test).    NUTRITION  Drink plenty of fluids.   You may resume your normal diet as instructed by your doctor.   Begin with a light meal and progress to your normal diet. Heavy or fried foods are harder to digest and may make you feel sick to your stomach (nauseated).   Avoid alcoholic beverages for 24 hours or as instructed.    MEDICATIONS  You may resume your normal medications.   WHAT YOU CAN EXPECT TODAY  Some feelings of bloating in the abdomen.   Passage of more gas than usual.   Spotting of blood in your stool or on the toilet paper  .  IF YOU HAD POLYPS REMOVED DURING THE  COLONOSCOPY:  Eat a soft diet IF YOU HAVE NAUSEA, BLOATING, ABDOMINAL PAIN, OR VOMITING.    FINDING OUT THE RESULTS OF YOUR TEST Not all test results are available during your visit. DR. Oneida Alar WILL CALL YOU WITHIN 14 DAYS OF YOUR PROCEDUE WITH YOUR RESULTS. Do not assume everything is normal if you have not heard from DR. Rakeen Gaillard, CALL HER OFFICE AT [phone].  SEEK IMMEDIATE MEDICAL ATTENTION AND CALL THE OFFICE: [phone] IF:  You have more than a spotting of blood in your stool.   Your belly is swollen (abdominal distention).   You are nauseated or vomiting.   You have a temperature over 101F.   You have abdominal pain or discomfort that is severe or gets worse throughout the day.   High-Fiber Diet A high-fiber diet changes your normal diet to include more whole grains, legumes, fruits, and vegetables. Changes in the diet involve replacing refined carbohydrates with unrefined foods. The calorie level of the diet is essentially unchanged. The Dietary Reference Intake (recommended amount) for adult males is 38 grams per day. For adult females, it is 25 grams per day. Pregnant and lactating women should consume 28 grams of fiber per day. Fiber is the intact part of a plant that is not broken down during digestion. Functional fiber is fiber that has been isolated from the plant to provide a beneficial effect in the body. PURPOSE  Increase stool bulk.  Ease and regulate bowel movements.   Lower cholesterol.   REDUCE RISK OF COLON CANCER  INDICATIONS THAT YOU NEED MORE FIBER  Constipation and hemorrhoids.   Uncomplicated diverticulosis (intestine condition) and irritable bowel syndrome.   Weight management.   As a protective measure against hardening of the arteries (atherosclerosis), diabetes, and cancer.   GUIDELINES FOR INCREASING FIBER IN THE DIET  Start adding fiber to the diet slowly. A gradual increase of about 5 more grams (2 slices of whole-wheat bread, 2  servings of most fruits or vegetables, or 1 bowl of high-fiber cereal) per day is best. Too rapid an increase in fiber may result in constipation, flatulence, and bloating.   Drink enough water and fluids to keep your urine clear or pale yellow. Water, juice, or caffeine-free drinks are recommended. Not drinking enough fluid may cause constipation.   Eat a variety of high-fiber foods rather than one type of fiber.   Try to increase your intake of fiber through using high-fiber foods rather than fiber pills or supplements that contain small amounts of fiber.   The goal is to change the types of food eaten. Do not supplement your present diet with high-fiber foods, but replace foods in your present diet.   INCLUDE A VARIETY OF FIBER SOURCES  Replace refined and processed grains with whole grains, canned fruits with fresh fruits, and incorporate other fiber sources. White rice, white breads, and most bakery goods contain little or no fiber.   Brown whole-grain rice, buckwheat oats, and many fruits and vegetables are all good sources of fiber. These include: broccoli, Brussels sprouts, cabbage, cauliflower, beets, sweet potatoes, white potatoes (skin on), carrots, tomatoes, eggplant, squash, berries, fresh fruits, and dried fruits.   Cereals appear to be the richest source of fiber. Cereal fiber is found in whole grains and bran. Bran is the fiber-rich outer coat of cereal grain, which is largely removed in refining. In whole-grain cereals, the bran remains. In breakfast cereals, the largest amount of fiber is found in those with "bran" in their names. The fiber content is sometimes indicated on the label.   You may need to include additional fruits and vegetables each day.   In baking, for 1 cup white flour, you may use the following substitutions:   1 cup whole-wheat flour minus 2 tablespoons.   1/2 cup white flour plus 1/2 cup whole-wheat flour.   Polyps, Colon  A polyp is extra tissue that  grows inside your body. Colon polyps grow in the large intestine. The large intestine, also called the colon, is part of your digestive system. It is a long, hollow tube at the end of your digestive tract where your body makes and stores stool. Most polyps are not dangerous. They are benign. This means they are not cancerous. But over time, some types of polyps can turn into cancer. Polyps that are smaller than a pea are usually not harmful. But larger polyps could someday become or may already be cancerous. To be safe, doctors remove all polyps and test them.   WHO GETS POLYPS? Anyone can get polyps, but certain people are more likely than others. You may have a greater chance of getting polyps if:  You are over 50.   You have had polyps before.   Someone in your family has had polyps.   Someone in your family has had cancer of the large intestine.   Find out if someone in your family has had polyps. You may also be  more likely to get polyps if you:   Eat a lot of fatty foods   Smoke   Drink alcohol   Do not exercise  Eat too much   PREVENTION There is not one sure way to prevent polyps. You might be able to lower your risk of getting them if you:  Eat more fruits and vegetables and less fatty food.   Do not smoke.   Avoid alcohol.   Exercise every day.   Lose weight if you are overweight.   Eating more calcium and folate can also lower your risk of getting polyps. Some foods that are rich in calcium are milk, cheese, and broccoli. Some foods that are rich in folate are chickpeas, kidney beans, and spinach.

## 2017-09-22 ENCOUNTER — Encounter (HOSPITAL_COMMUNITY): Payer: Self-pay | Admitting: Gastroenterology

## 2017-09-24 NOTE — Progress Notes (Signed)
Pt is aware.  

## 2017-10-22 DIAGNOSIS — J45998 Other asthma: Secondary | ICD-10-CM | POA: Diagnosis not present

## 2017-10-22 DIAGNOSIS — J4520 Mild intermittent asthma, uncomplicated: Secondary | ICD-10-CM | POA: Diagnosis not present

## 2017-10-22 DIAGNOSIS — Z1389 Encounter for screening for other disorder: Secondary | ICD-10-CM | POA: Diagnosis not present

## 2017-10-22 DIAGNOSIS — J309 Allergic rhinitis, unspecified: Secondary | ICD-10-CM | POA: Diagnosis not present

## 2017-10-22 DIAGNOSIS — Z0001 Encounter for general adult medical examination with abnormal findings: Secondary | ICD-10-CM | POA: Diagnosis not present

## 2017-10-22 DIAGNOSIS — J329 Chronic sinusitis, unspecified: Secondary | ICD-10-CM | POA: Diagnosis not present

## 2017-10-22 DIAGNOSIS — Z79899 Other long term (current) drug therapy: Secondary | ICD-10-CM | POA: Diagnosis not present

## 2017-10-22 DIAGNOSIS — Z0000 Encounter for general adult medical examination without abnormal findings: Secondary | ICD-10-CM | POA: Diagnosis not present

## 2017-10-22 DIAGNOSIS — M1990 Unspecified osteoarthritis, unspecified site: Secondary | ICD-10-CM | POA: Diagnosis not present

## 2017-10-22 DIAGNOSIS — Z1331 Encounter for screening for depression: Secondary | ICD-10-CM | POA: Diagnosis not present

## 2017-12-11 IMAGING — DX DG SHOULDER 2+V*L*
3 series · 3 of 3 positions shown · non-contrast
Comparison: No prior.

CLINICAL DATA: Shoulder pain.  No known injury.

EXAM:
LEFT SHOULDER - 2+ VIEW

[shoulder grashey]
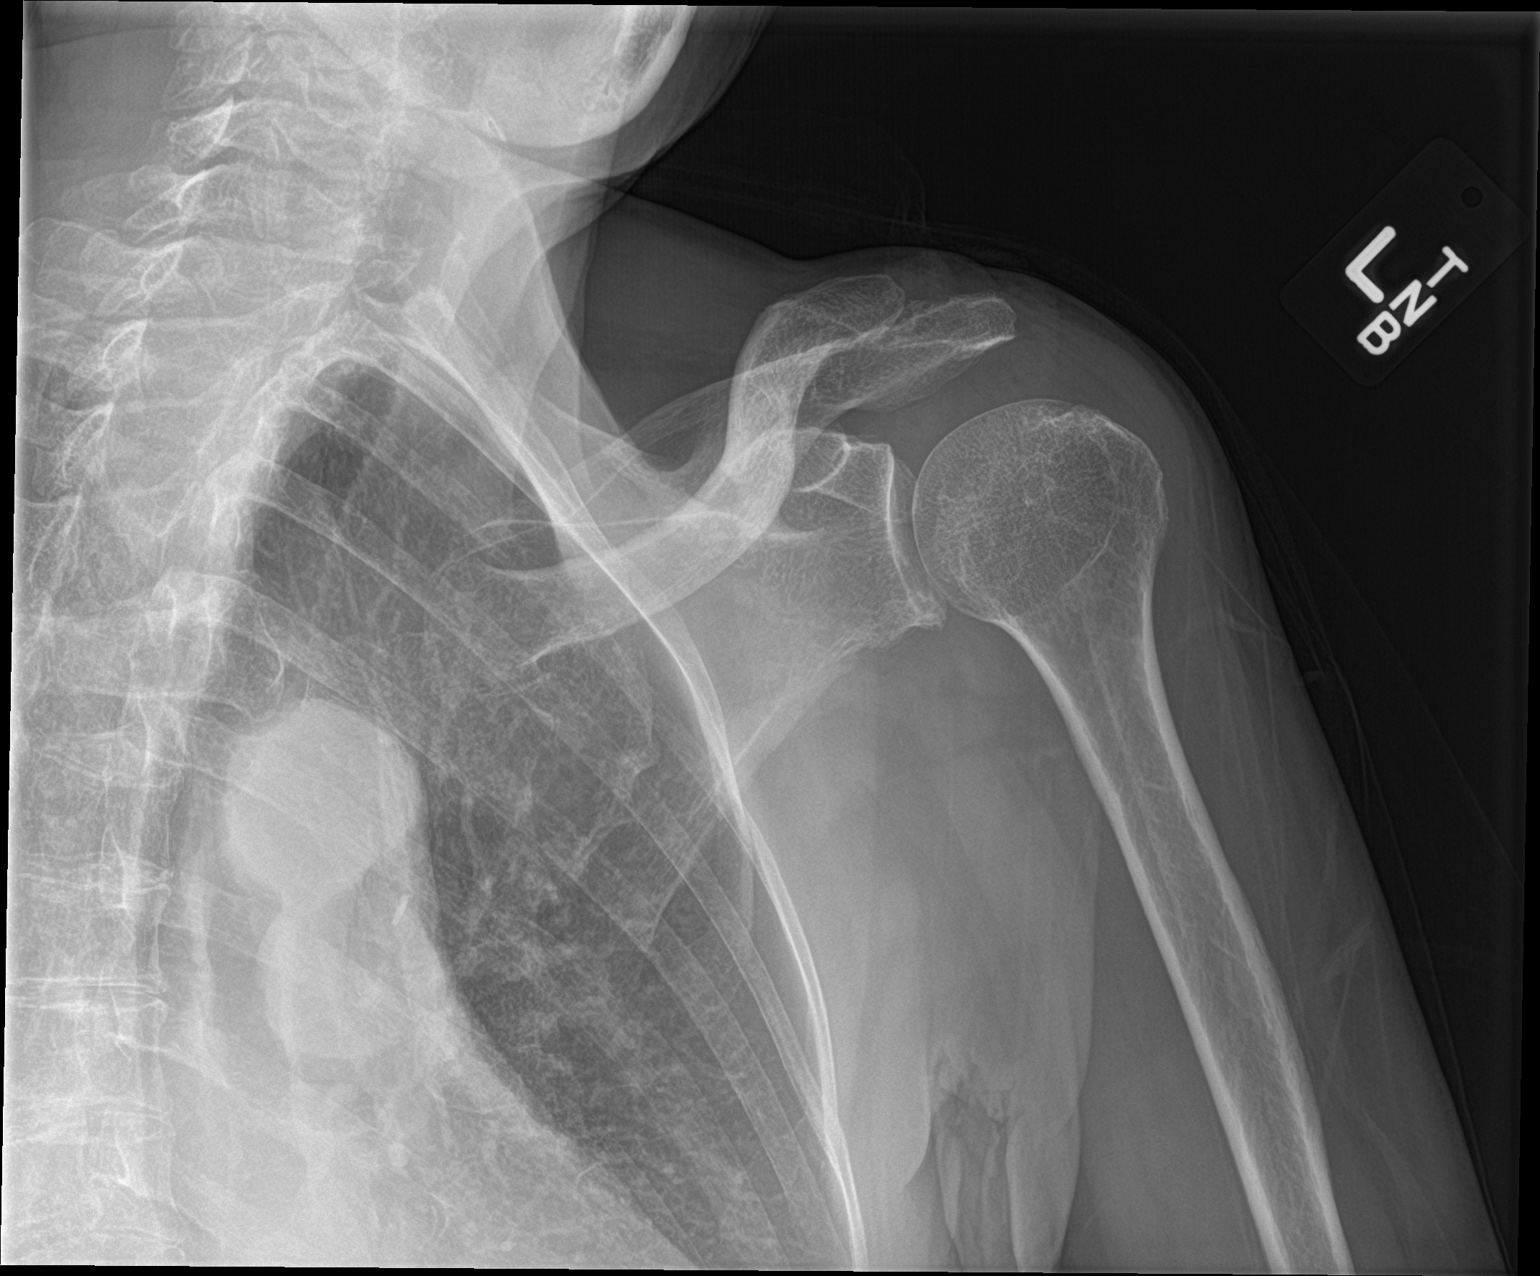

[shoulder y view]
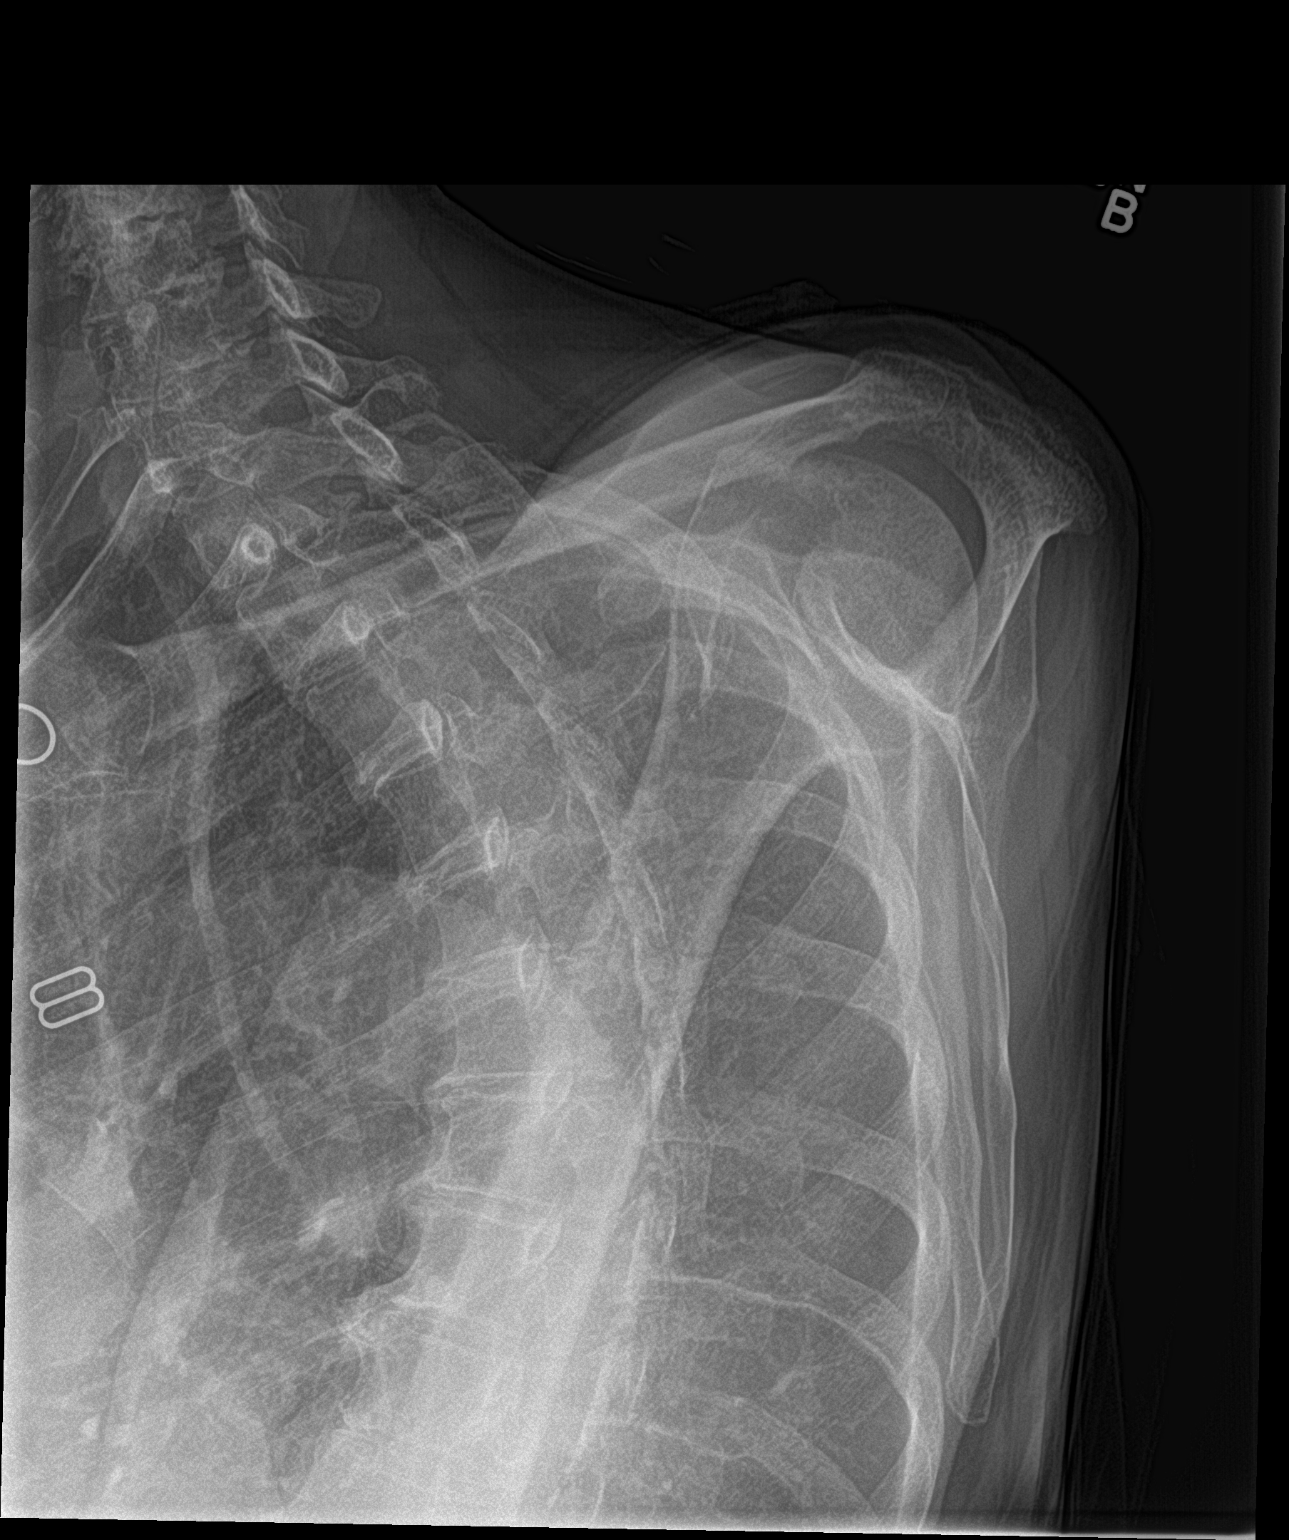

[shoulder axillary]
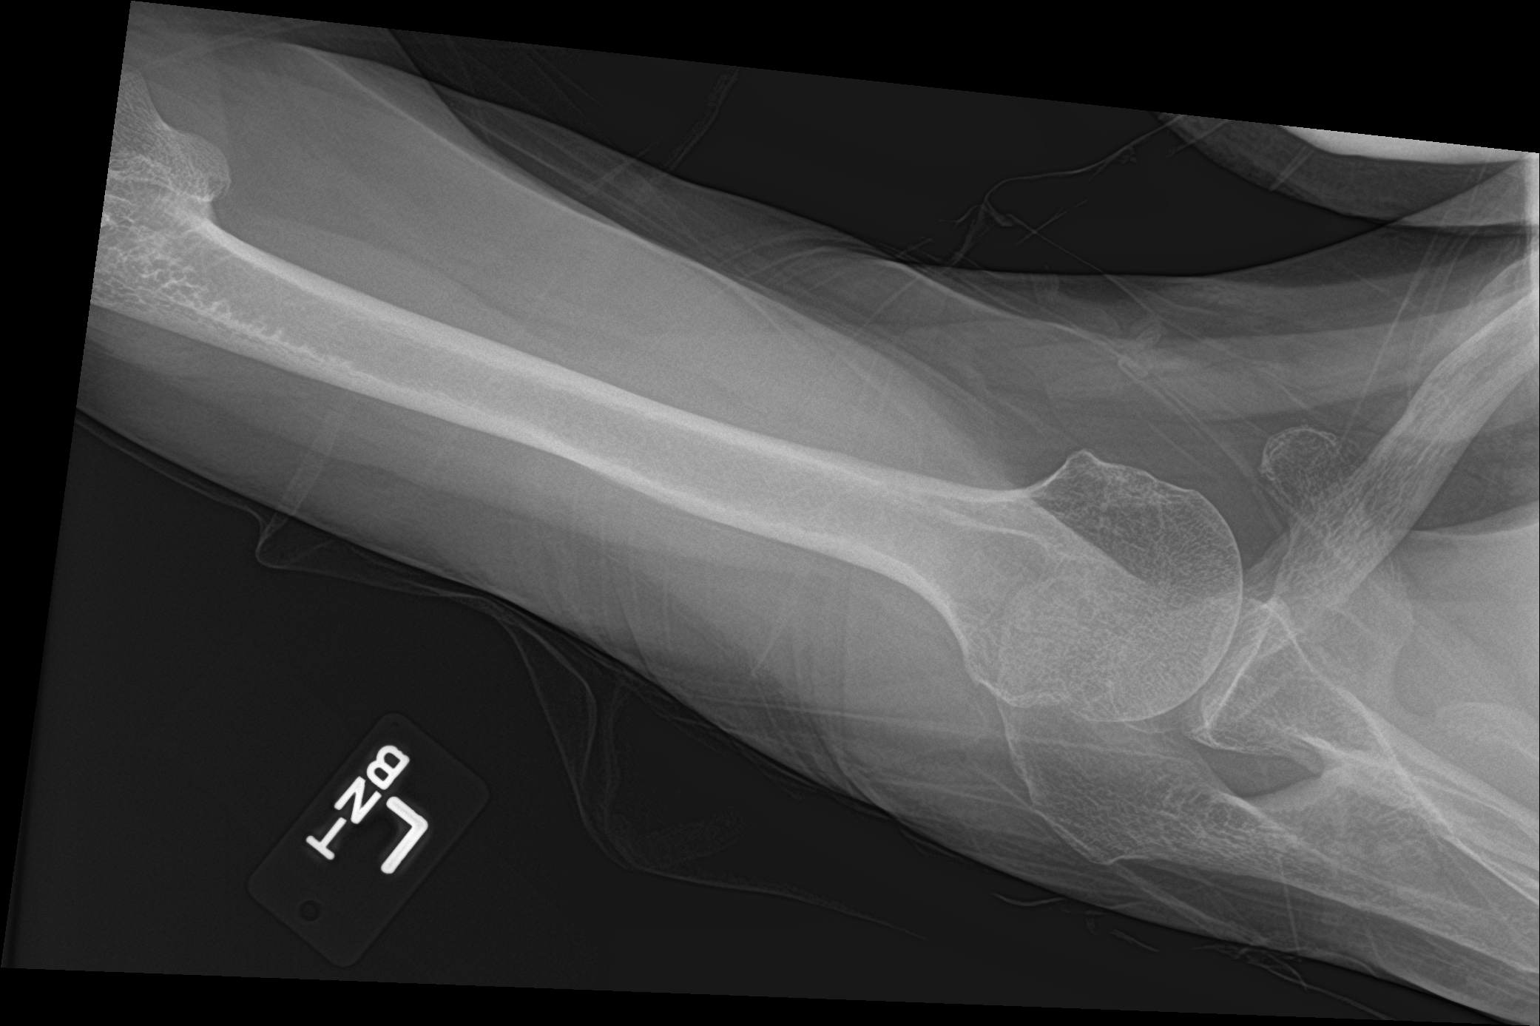

[3 of 3 positions shown; findings below may reference images not displayed]

FINDINGS: No acute bony or joint abnormality identified. No evidence of
fracture dislocation. Diffuse osteopenia. Acromioclavicular and
glenohumeral degenerative change.
IMPRESSION: Diffuse osteopenia. Acromioclavicular and glenohumeral degenerative
change. No acute abnormality.

## 2018-01-03 ENCOUNTER — Encounter (HOSPITAL_COMMUNITY): Payer: Self-pay | Admitting: Emergency Medicine

## 2018-01-03 ENCOUNTER — Other Ambulatory Visit: Payer: Self-pay

## 2018-01-03 ENCOUNTER — Emergency Department (HOSPITAL_COMMUNITY)
Admission: EM | Admit: 2018-01-03 | Discharge: 2018-01-03 | Disposition: A | Discharge status: Home / Self Care | Payer: Medicare Other | Attending: Emergency Medicine | Admitting: Emergency Medicine

## 2018-01-03 ENCOUNTER — Emergency Department (HOSPITAL_COMMUNITY): Payer: Medicare Other

## 2018-01-03 DIAGNOSIS — F17210 Nicotine dependence, cigarettes, uncomplicated: Secondary | ICD-10-CM | POA: Diagnosis not present

## 2018-01-03 DIAGNOSIS — K5900 Constipation, unspecified: Secondary | ICD-10-CM | POA: Insufficient documentation

## 2018-01-03 DIAGNOSIS — J449 Chronic obstructive pulmonary disease, unspecified: Secondary | ICD-10-CM | POA: Diagnosis not present

## 2018-01-03 DIAGNOSIS — K5641 Fecal impaction: Secondary | ICD-10-CM

## 2018-01-03 NOTE — ED Provider Notes (Signed)
[facility] EMERGENCY DEPARTMENT Provider Note   CSN: [id number] Arrival date & time: 01/03/18  1359     History   Chief Complaint Chief Complaint  Patient presents with  . Constipation    HPI Emily Nichols is a 77 y.o. female.  HPI  Pt was seen at 1405. Per pt, c/o gradual onset and persistence of constant "constipation" for the past 3 days. Pt states her last BM was 3 days ago and "it was normal." Pt states she took a laxative last night and passed loose and watery stool today. Denies N/V, no black or blood in stools, no rectal pain, no rectal discharge, no rectal bleeding, no abd pain, no back pain, no fevers.   Past Medical History:  Diagnosis Date  . Colon polyps   . COPD (chronic obstructive pulmonary disease) ([place])   . Seasonal allergies     Patient Active Problem List   Diagnosis Date Noted  . History of adenomatous polyp of colon 09/03/2017  . Taking medication for chronic disease 09/03/2017    Past Surgical History:  Procedure Laterality Date  . COLONOSCOPY N/A 04/07/2014   Procedure: COLONOSCOPY;  Surgeon: Danie Binder, MD;  [facility];  Service: Endoscopy;  Laterality: N/A;  8:30 AM  . COLONOSCOPY    . COLONOSCOPY N/A 09/18/2017   Procedure: COLONOSCOPY;  Surgeon: Danie Binder, MD;  Location: [facility];  Service: Endoscopy;  Laterality: N/A;  2:00pm     OB History   None      Home Medications    Prior to Admission medications   Medication Sig Start Date End Date Taking? Authorizing Provider  ADVAIR DISKUS 250-50 MCG/DOSE AEPB INHALE ONE PUFF BY MOUTH TWICE DAILY 11/19/15  Yes [provider]  Calcium Carbonate-Vitamin D (CALCIUM-VITAMIN D3 PO) Take 1 tablet by mouth daily.   Yes [provider]  fluticasone (FLONASE) 50 MCG/ACT nasal spray Place 2 sprays into both nostrils daily as needed for allergies.    Yes [provider]  loratadine (CLARITIN) 10 MG tablet Take 10 mg by mouth daily as needed for  allergies.    Yes [provider]  montelukast (SINGULAIR) 10 MG tablet Take 10 mg by mouth daily.    Yes [provider]  naproxen sodium (ALEVE) 220 MG tablet Take 220 mg by mouth as needed.   Yes [provider]    Family History Family History  Problem Relation Age of Onset  . Colon cancer Neg Hx   . Colon polyps Neg Hx     Social History Social History   Tobacco Use  . Smoking status: Current Some Day Smoker    Years: 10.00    Types: Cigarettes  . Smokeless tobacco: Never Used  . Tobacco comment: "Sometimes I just light up one and smoke it."  Substance Use Topics  . Alcohol use: No  . Drug use: No     Allergies   Patient has no known allergies.   Review of Systems Review of Systems ROS: Statement: All systems negative except as marked or noted in the HPI; Constitutional: Negative for fever and chills. ; ; Eyes: Negative for eye pain, redness and discharge. ; ; ENMT: Negative for ear pain, hoarseness, nasal congestion, sinus pressure and sore throat. ; ; Cardiovascular: Negative for chest pain, palpitations, diaphoresis, dyspnea and peripheral edema. ; ; Respiratory: Negative for cough, wheezing and stridor. ; ; Gastrointestinal: +constipation. Negative for nausea, vomiting, diarrhea, abdominal pain, blood in stool,  hematemesis, jaundice and rectal bleeding. . ; ; Genitourinary: Negative for dysuria, flank pain and hematuria. ; ; Musculoskeletal: Negative for back pain and neck pain. Negative for swelling and trauma.; ; Skin: Negative for pruritus, rash, abrasions, blisters, bruising and skin lesion.; ; Neuro: Negative for headache, lightheadedness and neck stiffness. Negative for weakness, altered level of consciousness, altered mental status, extremity weakness, paresthesias, involuntary movement, seizure and syncope.       Physical Exam Updated Vital Signs BP 125/85 (BP Location: Right Arm)   Pulse 83   Temp 98.1 F (36.7 C) (Oral)   Resp  18   Ht 5\' 5"  (1.651 m)   Wt 68.9 kg (152 lb)   SpO2 98%   BMI 25.29 kg/m   Physical Exam 1410: Physical examination:  Nursing notes reviewed; Vital signs and O2 SAT reviewed;  Constitutional: Well developed, Well nourished, Well hydrated, In no acute distress; Head:  Normocephalic, atraumatic; Eyes: EOMI, PERRL, No scleral icterus; ENMT: Mouth and pharynx normal, Mucous membranes moist; Neck: Supple, Full range of motion, No lymphadenopathy; Cardiovascular: Regular rate and rhythm, No gallop; Respiratory: Breath sounds clear & equal bilaterally, No wheezes.  Speaking full sentences with ease, Normal respiratory effort/excursion; Chest: Nontender, Movement normal; Abdomen: Soft, Nontender, Nondistended, Normal bowel sounds. Rectal exam performed with pt permission and ED RN chaperone: +large amount of soft yellow stool in rectal vault.; Genitourinary: No CVA tenderness; Extremities: Peripheral pulses normal, No tenderness, No edema, No calf edema or asymmetry.; Neuro: AA&Ox3, Major CN grossly intact.  Speech clear. No gross focal motor or sensory deficits in extremities. Climbs on and off stretcher easily by herself. Gait steady..; Skin: Color normal, Warm, Dry.   ED Treatments / Results  Labs (all labs ordered are listed, but only abnormal results are displayed)   EKG None  Radiology   Procedures Procedures (including critical care time)  Medications Ordered in ED Medications - No data to display   Initial Impression / Assessment and Plan / ED Course  I have reviewed the triage vital signs and the nursing notes.  Pertinent labs & imaging results that were available during my care of the patient were reviewed by me and considered in my medical decision making (see chart for details).  MDM Reviewed: previous chart, nursing note and vitals Interpretation: x-ray    Dg Abd Acute W/chest Result Date: 01/03/2018 CLINICAL DATA:  History of constipation EXAM: DG ABDOMEN ACUTE W/ 1V  CHEST COMPARISON:  Chest radiograph 12/21/2015 FINDINGS: Stable cardiac and mediastinal contours with tortuosity of the thoracic aorta. No consolidative pulmonary opacities. No pleural effusion or pneumothorax. Stool throughout the descending and sigmoid colon. Nonobstructed bowel gas pattern. No free intraperitoneal air. IMPRESSION: No acute cardiopulmonary process. Stool throughout the colon as can be seen with constipation. Electronically Signed   By: Lovey Newcomer M.D.   On: 01/03/2018 14:59     1530:  Pt had large amount of very soft stool in rectal vault; I softened this stool of further and was able to disimpact pt of a small amount (pt's tolerance precluded further stool removal). Pt's abd remained benign.  Pt then walked to restroom. Pt stated she did not have a BM. When asked, she stated she "just sat there and nothing came out." ED RN and I encouraged pt to slightly/very gently bear down to push stool out of her rectum while sitting on the toilet. Pt returned to restroom to try again. Afterwards, pt the told ED RN and I that stool was evacuated.  Pt states she is ready to leave now. Dx and testing d/w pt.  Questions answered.  Verb understanding, agreeable to d/c home with outpt f/u.         Final Clinical Impressions(s) / ED Diagnoses   Final diagnoses:  None    ED Discharge Orders    None       Francine Graven, DO 01/08/18 0725

## 2018-01-03 NOTE — ED Notes (Signed)
Pt report some stool evacuated at this time.

## 2018-01-03 NOTE — ED Triage Notes (Signed)
Patient c/o constipation. Per patient last normal BM 3 days ago. Patient states filling of needing to have a BM but loose stool with some incontinency. Denies any abd pain, nausea, or vomiting.

## 2018-01-03 NOTE — Discharge Instructions (Signed)
Take over the counter laxative (such as miralax, milk of magnesia, senokot) AND a dulcolax suppository today and repeat both tomorrow.  Begin to take over the counter stool softener (such as colace), as directed on packaging, for the next month.  Continue to take your usual prescriptions as previously directed.  Call your regular medical doctor and your GI doctor on Monday to schedule a follow up appointment within the next week.  Return to the Emergency Department immediately if worsening.

## 2018-01-22 DIAGNOSIS — J309 Allergic rhinitis, unspecified: Secondary | ICD-10-CM | POA: Diagnosis not present

## 2018-01-22 DIAGNOSIS — M1990 Unspecified osteoarthritis, unspecified site: Secondary | ICD-10-CM | POA: Diagnosis not present

## 2018-01-22 DIAGNOSIS — J4520 Mild intermittent asthma, uncomplicated: Secondary | ICD-10-CM | POA: Diagnosis not present

## 2018-02-19 IMAGING — DX DG ABDOMEN ACUTE W/ 1V CHEST
3 series · 3 of 3 positions shown · non-contrast
Comparison: Chest radiograph 02/20/2014

CLINICAL DATA: Patient with constipation.  Back pain.

EXAM:
DG ABDOMEN ACUTE W/ 1V CHEST

[chest pa]
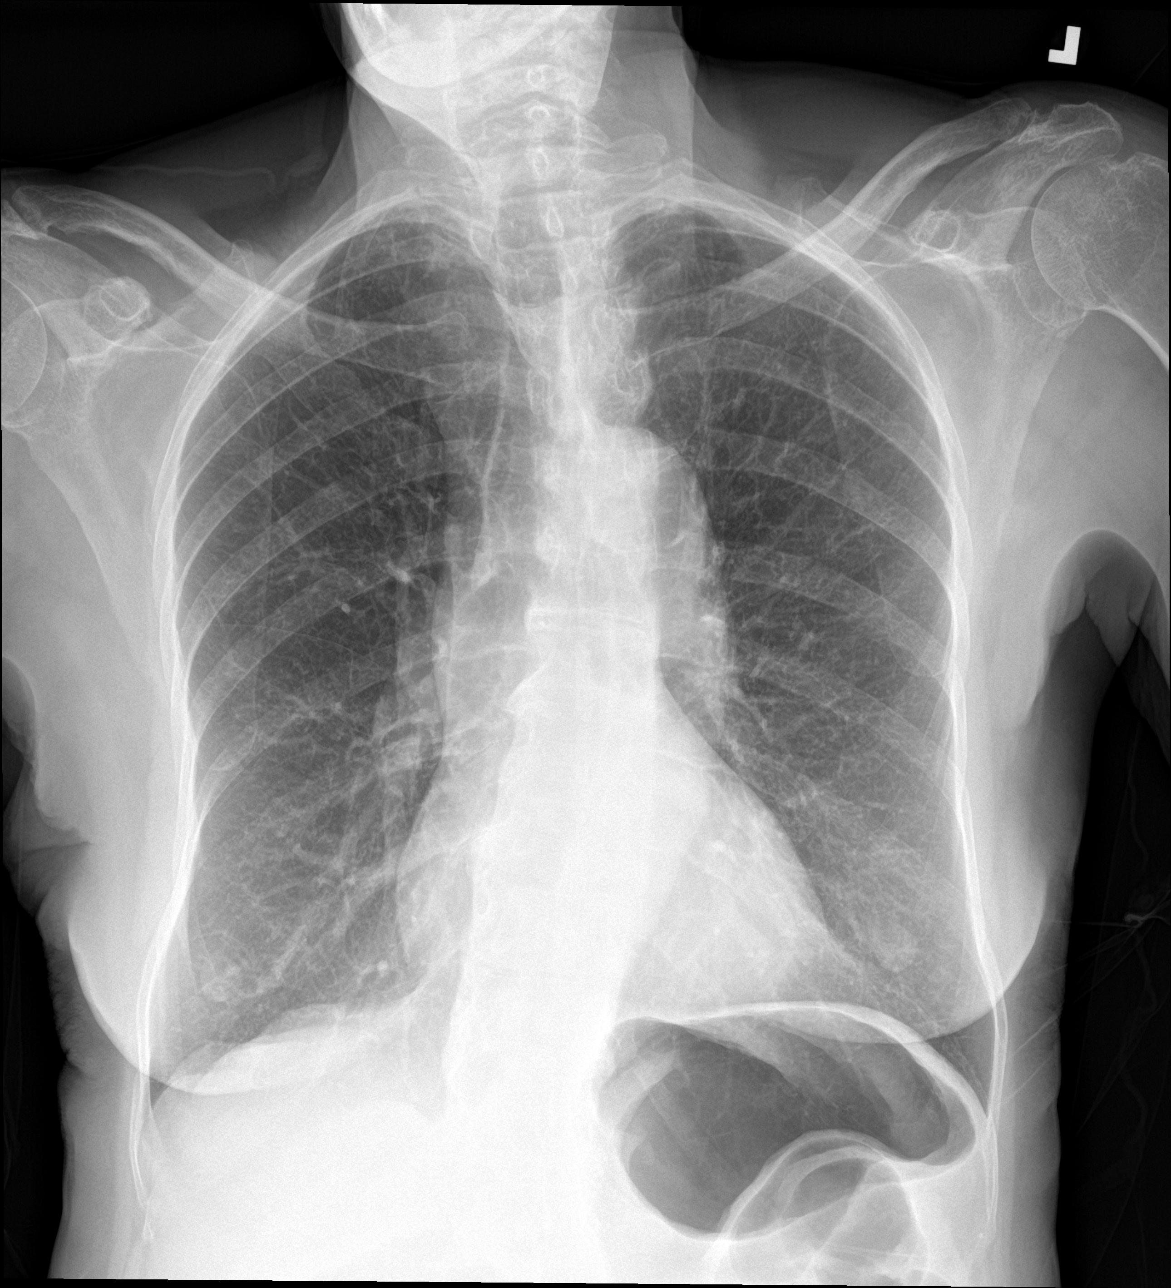

[abdomen erect]
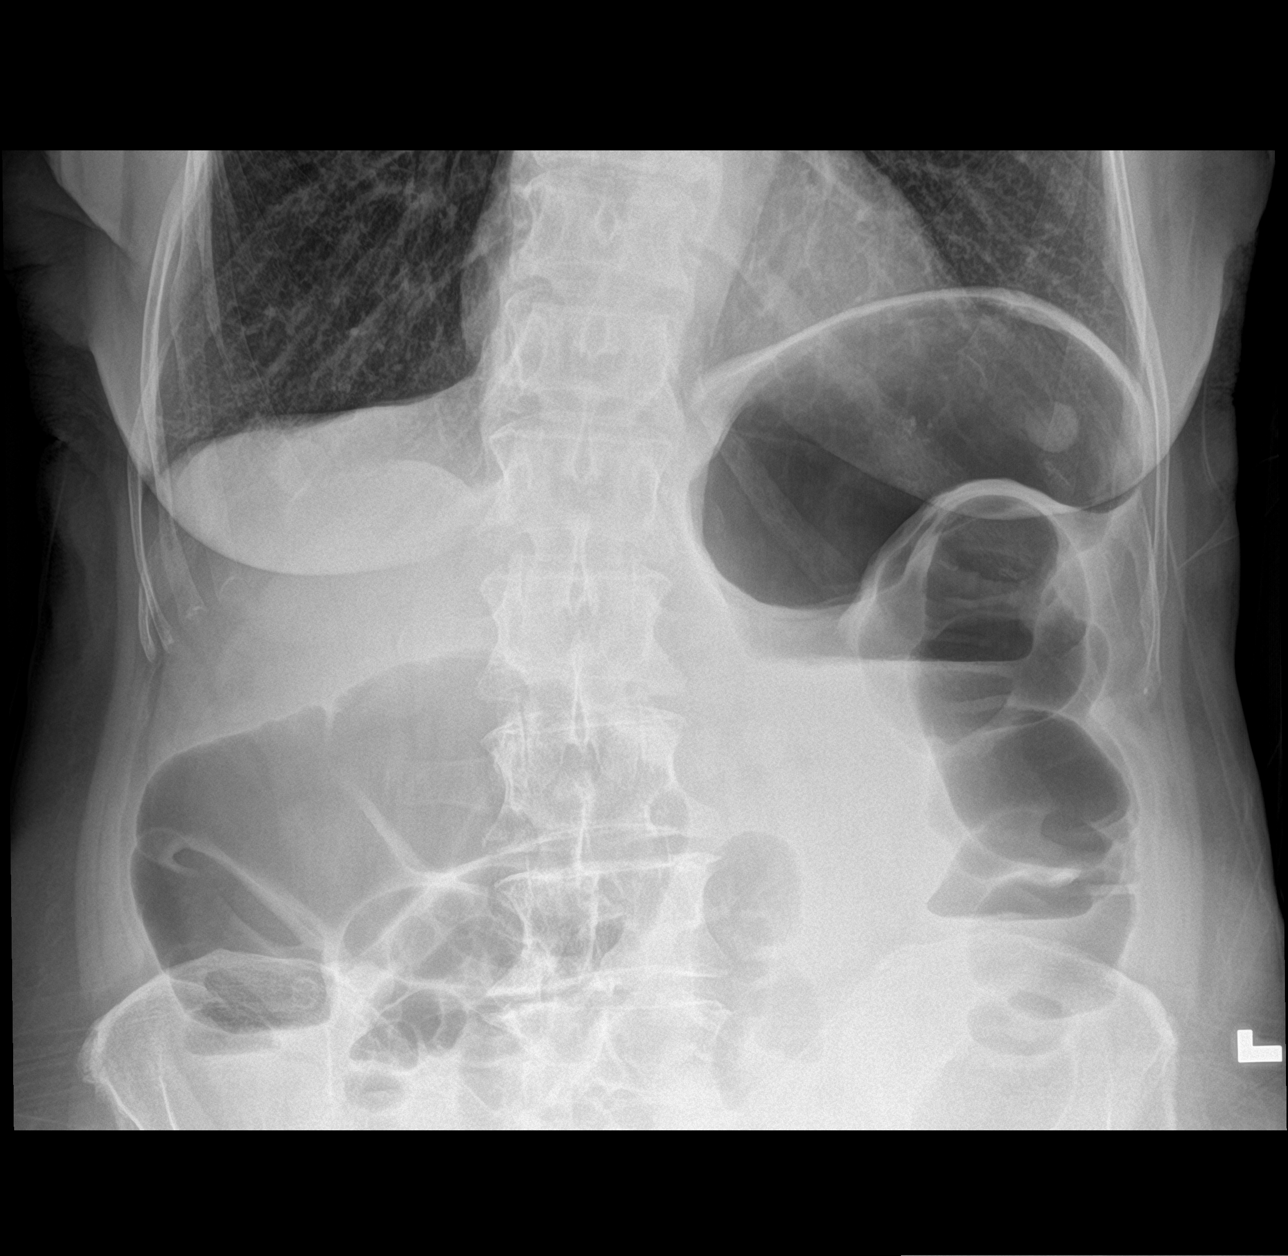

[abdomen supine]
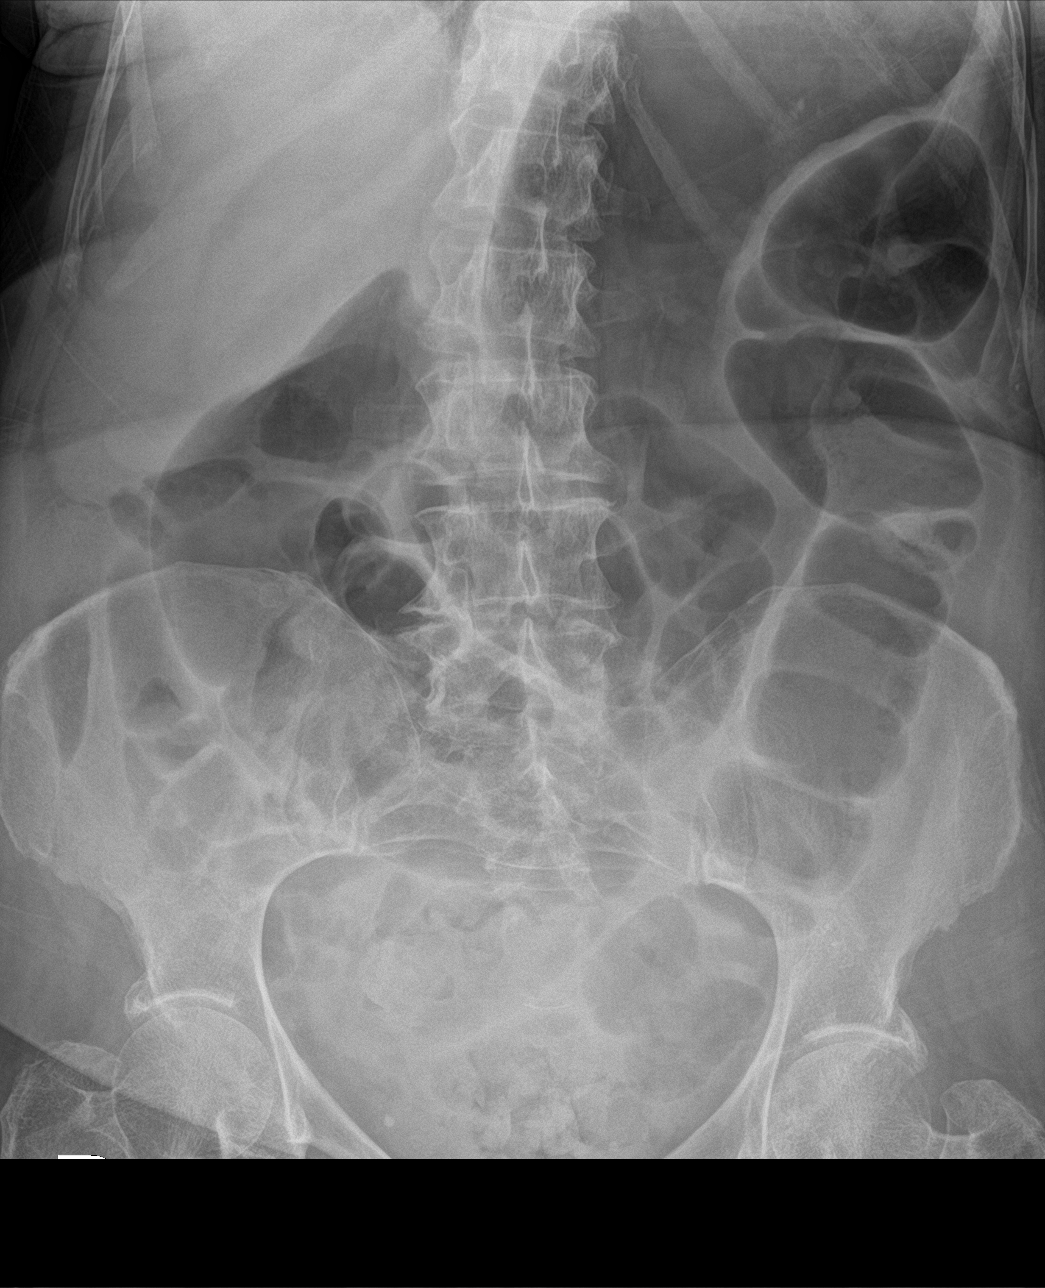

[3 of 3 positions shown; findings below may reference images not displayed]

FINDINGS: Stable cardiac and mediastinal contours. No consolidative pulmonary
opacities. No pleural effusion or pneumothorax. Nipple shadows
overlying the lower lungs bilaterally. Stomach is distended. Gaseous
distention of the colon. Stool within the rectum. Nonobstructed
small bowel. No free intraperitoneal air on the upright images.
IMPRESSION: Stool within the sigmoid colon and rectum. Gaseous distention of the
colon. No evidence for small bowel obstruction.

No acute cardiopulmonary process.

## 2018-03-09 ENCOUNTER — Encounter (HOSPITAL_COMMUNITY): Payer: Self-pay | Admitting: Emergency Medicine

## 2018-03-09 ENCOUNTER — Inpatient Hospital Stay (HOSPITAL_COMMUNITY)
Admission: EM | Admit: 2018-03-09 | Discharge: 2018-03-11 | DRG: 378 | Disposition: A | Discharge status: Home / Self Care | Payer: Medicare Other | Attending: Family Medicine | Admitting: Family Medicine

## 2018-03-09 ENCOUNTER — Other Ambulatory Visit: Payer: Self-pay

## 2018-03-09 DIAGNOSIS — M1990 Unspecified osteoarthritis, unspecified site: Secondary | ICD-10-CM

## 2018-03-09 DIAGNOSIS — K228 Other specified diseases of esophagus: Secondary | ICD-10-CM | POA: Diagnosis not present

## 2018-03-09 DIAGNOSIS — D649 Anemia, unspecified: Secondary | ICD-10-CM | POA: Diagnosis not present

## 2018-03-09 DIAGNOSIS — K449 Diaphragmatic hernia without obstruction or gangrene: Secondary | ICD-10-CM | POA: Diagnosis not present

## 2018-03-09 DIAGNOSIS — K250 Acute gastric ulcer with hemorrhage: Secondary | ICD-10-CM | POA: Diagnosis not present

## 2018-03-09 DIAGNOSIS — K922 Gastrointestinal hemorrhage, unspecified: Secondary | ICD-10-CM | POA: Diagnosis present

## 2018-03-09 DIAGNOSIS — I959 Hypotension, unspecified: Secondary | ICD-10-CM | POA: Diagnosis present

## 2018-03-09 DIAGNOSIS — K2961 Other gastritis with bleeding: Secondary | ICD-10-CM | POA: Diagnosis present

## 2018-03-09 DIAGNOSIS — K2970 Gastritis, unspecified, without bleeding: Secondary | ICD-10-CM | POA: Diagnosis not present

## 2018-03-09 DIAGNOSIS — F17210 Nicotine dependence, cigarettes, uncomplicated: Secondary | ICD-10-CM | POA: Diagnosis present

## 2018-03-09 DIAGNOSIS — K920 Hematemesis: Secondary | ICD-10-CM

## 2018-03-09 DIAGNOSIS — K259 Gastric ulcer, unspecified as acute or chronic, without hemorrhage or perforation: Secondary | ICD-10-CM | POA: Diagnosis not present

## 2018-03-09 DIAGNOSIS — J449 Chronic obstructive pulmonary disease, unspecified: Secondary | ICD-10-CM

## 2018-03-09 DIAGNOSIS — K254 Chronic or unspecified gastric ulcer with hemorrhage: Secondary | ICD-10-CM | POA: Diagnosis not present

## 2018-03-09 DIAGNOSIS — D62 Acute posthemorrhagic anemia: Secondary | ICD-10-CM | POA: Diagnosis not present

## 2018-03-09 DIAGNOSIS — Z66 Do not resuscitate: Secondary | ICD-10-CM | POA: Diagnosis not present

## 2018-03-09 DIAGNOSIS — R110 Nausea: Secondary | ICD-10-CM | POA: Diagnosis not present

## 2018-03-09 HISTORY — DX: Unspecified osteoarthritis, unspecified site: M19.90

## 2018-03-09 LAB — COMPREHENSIVE METABOLIC PANEL
ALT: 14 U/L (ref 0–44)
AST: 20 U/L (ref 15–41)
Albumin: 3.8 g/dL (ref 3.5–5.0)
Alkaline Phosphatase: 73 U/L (ref 38–126)
Anion gap: 6 (ref 5–15)
BUN: 24 mg/dL — ABNORMAL HIGH (ref 8–23)
CO2: 28 mmol/L (ref 22–32)
Calcium: 10 mg/dL (ref 8.9–10.3)
Chloride: 107 mmol/L (ref 98–111)
Creatinine, Ser: 0.91 mg/dL (ref 0.44–1.00)
GFR calc Af Amer: >60 mL/min (ref >60)
GFR calc non Af Amer: 59 mL/min — ABNORMAL LOW (ref >60)
Glucose, Bld: 104 mg/dL — ABNORMAL HIGH (ref 70–99)
Potassium: 4 mmol/L (ref 3.5–5.1)
Sodium: 141 mmol/L (ref 135–145)
Total Bilirubin: 0.6 mg/dL (ref 0.3–1.2)
Total Protein: 7.4 g/dL (ref 6.5–8.1)

## 2018-03-09 LAB — CBC
HCT: 36 % (ref 36.0–46.0)
Hemoglobin: 11.2 g/dL — ABNORMAL LOW (ref 12.0–15.0)
MCH: 26.5 pg (ref 26.0–34.0)
MCHC: 31.1 g/dL (ref 30.0–36.0)
MCV: 85.1 fL (ref 78.0–100.0)
Platelets: 350 10*3/uL (ref 150–400)
RBC: 4.23 MIL/uL (ref 3.87–5.11)
RDW: 14.8 % (ref 11.5–15.5)
WBC: 7.5 10*3/uL (ref 4.0–10.5)

## 2018-03-09 LAB — HEMOGLOBIN AND HEMATOCRIT, BLOOD
HCT: 30.7 % — ABNORMAL LOW (ref 36.0–46.0)
Hemoglobin: 9.5 g/dL — ABNORMAL LOW (ref 12.0–15.0)

## 2018-03-09 LAB — TYPE AND SCREEN
ABO/RH(D): O POS
Antibody Screen: NEGATIVE

## 2018-03-09 LAB — PROTIME-INR
INR: 1.04
Prothrombin Time: 13.5 seconds (ref 11.4–15.2)

## 2018-03-09 LAB — LIPASE, BLOOD: Lipase: 29 U/L (ref 11–51)

## 2018-03-09 MED ORDER — ACETAMINOPHEN 650 MG RE SUPP: 650.0000 mg | Freq: Four times a day (QID) | RECTAL | Status: DC | PRN

## 2018-03-09 MED ORDER — ONDANSETRON HCL 4 MG/2ML IJ SOLN
4.0000 mg | Freq: Once | INTRAMUSCULAR | Status: AC
  Administered 2018-03-09: 4 mg via INTRAVENOUS
  Filled 2018-03-09: qty 2

## 2018-03-09 MED ORDER — SODIUM CHLORIDE 0.9 % IV BOLUS
1000.0000 mL | Freq: Once | INTRAVENOUS | Status: AC
  Administered 2018-03-09: 1000 mL via INTRAVENOUS

## 2018-03-09 MED ORDER — SODIUM CHLORIDE 0.9 % IV SOLN
INTRAVENOUS | Status: DC
  Administered 2018-03-09 – 2018-03-11 (×2): via INTRAVENOUS

## 2018-03-09 MED ORDER — MOMETASONE FURO-FORMOTEROL FUM 200-5 MCG/ACT IN AERO
2.0000 | INHALATION_SPRAY | Freq: Two times a day (BID) | RESPIRATORY_TRACT | Status: DC
  Administered 2018-03-10 – 2018-03-11 (×2): 2 via RESPIRATORY_TRACT
  Filled 2018-03-09: qty 8.8

## 2018-03-09 MED ORDER — ACETAMINOPHEN 325 MG PO TABS: 650.0000 mg | ORAL_TABLET | Freq: Four times a day (QID) | ORAL | Status: DC | PRN

## 2018-03-09 MED ORDER — PANTOPRAZOLE SODIUM 40 MG IV SOLR
INTRAVENOUS | Status: AC
  Filled 2018-03-09: qty 80

## 2018-03-09 MED ORDER — FLUTICASONE PROPIONATE 50 MCG/ACT NA SUSP: 2.0000 | Freq: Every day | NASAL | Status: DC | PRN

## 2018-03-09 MED ORDER — ONDANSETRON HCL 4 MG PO TABS: 4.0000 mg | ORAL_TABLET | Freq: Four times a day (QID) | ORAL | Status: DC | PRN

## 2018-03-09 MED ORDER — ONDANSETRON HCL 4 MG/2ML IJ SOLN: 4.0000 mg | Freq: Four times a day (QID) | INTRAMUSCULAR | Status: DC | PRN

## 2018-03-09 MED ORDER — SODIUM CHLORIDE 0.9 % IV SOLN
8.0000 mg/h | INTRAVENOUS | Status: DC
  Administered 2018-03-10 – 2018-03-11 (×4): 8 mg/h via INTRAVENOUS
  Filled 2018-03-09 (×7): qty 80

## 2018-03-09 MED ORDER — FLEET ENEMA 7-19 GM/118ML RE ENEM: 1.0000 | ENEMA | Freq: Once | RECTAL | Status: DC | PRN

## 2018-03-09 MED ORDER — PANTOPRAZOLE SODIUM 40 MG IV SOLR
80.0000 mg | Freq: Once | INTRAVENOUS | Status: AC
  Administered 2018-03-09: 80 mg via INTRAVENOUS
  Filled 2018-03-09: qty 80

## 2018-03-09 MED ORDER — PANTOPRAZOLE SODIUM 40 MG IV SOLR: 40.0000 mg | Freq: Two times a day (BID) | INTRAVENOUS | Status: DC

## 2018-03-09 NOTE — ED Provider Notes (Signed)
[facility] EMERGENCY DEPARTMENT Provider Note   CSN: [id number] Arrival date & time: 03/09/18  1718     History   Chief Complaint Chief Complaint  Patient presents with  . Bloated    HPI Emily Nichols is a 77 y.o. female.  HPI   76yF with hematemesis. Says she was outside earlier this afternoon when she began having abdominal discomfort, felt nauseated and then vomited "red liquid and chunks of blood." In the middle of obtaining a history she began complaining of feeling very hot. She became diaphoretic. She looked like she was going to pass out. She was actually just brought back to her room and still in street clothes at this point. I sat her down on the stretcher and she subsequently almost filled up an entire emesis bag with hematemesis.   On further questioning she endorses about a 2-week history of early satiety.  Feeling very bloated.  Vague epigastric discomfort.  She denies any past history of ulcers.  Denies any significant alcohol use. No known liver issues that she is aware of. No blood thinners.  She has been taking 2 naproxen every morning for "quite a while" for arthritis.  She has had previous colonoscopy by Dr Oneida Alar.  She does not think she is ever had an EGD.  Past Medical History:  Diagnosis Date  . Colon polyps   . COPD (chronic obstructive pulmonary disease) ([place])   . Seasonal allergies     Patient Active Problem List   Diagnosis Date Noted  . History of adenomatous polyp of colon 09/03/2017  . Taking medication for chronic disease 09/03/2017    Past Surgical History:  Procedure Laterality Date  . COLONOSCOPY N/A 04/07/2014   Procedure: COLONOSCOPY;  Surgeon: Danie Binder, MD;  [facility];  Service: Endoscopy;  Laterality: N/A;  8:30 AM  . COLONOSCOPY    . COLONOSCOPY N/A 09/18/2017   Procedure: COLONOSCOPY;  Surgeon: Danie Binder, MD;  [facility];  Service: Endoscopy;  Laterality: N/A;  2:00pm     OB History    None      Home Medications    Prior to Admission medications   Medication Sig Start Date End Date Taking? Authorizing Provider  ADVAIR DISKUS 250-50 MCG/DOSE AEPB INHALE ONE PUFF BY MOUTH TWICE DAILY 11/19/15   [provider]  Calcium Carbonate-Vitamin D (CALCIUM-VITAMIN D3 PO) Take 1 tablet by mouth daily.    [provider]  fluticasone (FLONASE) 50 MCG/ACT nasal spray Place 2 sprays into both nostrils daily as needed for allergies.     [provider]  loratadine (CLARITIN) 10 MG tablet Take 10 mg by mouth daily as needed for allergies.     [provider]  montelukast (SINGULAIR) 10 MG tablet Take 10 mg by mouth daily.     [provider]  naproxen sodium (ALEVE) 220 MG tablet Take 220 mg by mouth as needed.    [provider]    Family History Family History  Problem Relation Age of Onset  . Colon cancer Neg Hx   . Colon polyps Neg Hx     Social History Social History   Tobacco Use  . Smoking status: Current Some Day Smoker    Years: 10.00    Types: Cigarettes  . Smokeless tobacco: Never Used  . Tobacco comment: "Sometimes I just light up one and smoke it."  Substance Use Topics  . Alcohol use: No  . Drug use: No  Allergies   Patient has no known allergies.   Review of Systems Review of Systems  All systems reviewed and negative, other than as noted in HPI.  Physical Exam Updated Vital Signs BP 116/78 (BP Location: Left Arm)   Pulse (!) 101   Temp 98 F (36.7 C) (Oral)   Resp 16   Ht 5\' 5"  (1.651 m)   Wt 67.1 kg (148 lb)   SpO2 100%   BMI 24.63 kg/m   Physical Exam  General: Appears tired, but not in acute distress. HEENT: Blood-tinged saliva.  No concerning lesions noted. Eyes: Pale conjunctive a. Heart: Mild tachycardia.  Regular rhythm. Lungs: Clear to auscultation bilaterally. Abdomen: Soft.  Nondistended.  Mild epigastric tenderness without rebound or guarding. Skin: Cool.   Clammy. Psych: Calm.  Cooperative.  Normal affect.  ED Treatments / Results  Labs (all labs ordered are listed, but only abnormal results are displayed) Labs Reviewed  COMPREHENSIVE METABOLIC PANEL - Abnormal; Notable for the following components:      Result Value   Glucose, Bld 104 (*)    BUN 24 (*)    GFR calc non Af Amer 59 (*)    All other components within normal limits  CBC - Abnormal; Notable for the following components:   Hemoglobin 11.2 (*)    All other components within normal limits  HEMOGLOBIN AND HEMATOCRIT, BLOOD - Abnormal; Notable for the following components:   Hemoglobin 9.5 (*)    HCT 30.7 (*)    All other components within normal limits  HEMOGLOBIN AND HEMATOCRIT, BLOOD - Abnormal; Notable for the following components:   Hemoglobin 8.4 (*)    HCT 26.9 (*)    All other components within normal limits  CBC - Abnormal; Notable for the following components:   RBC 3.12 (*)    Hemoglobin 8.2 (*)    HCT 27.3 (*)    All other components within normal limits  CBC - Abnormal; Notable for the following components:   RBC 3.37 (*)    Hemoglobin 8.9 (*)    HCT 29.2 (*)    All other components within normal limits  CBC - Abnormal; Notable for the following components:   RBC 3.13 (*)    Hemoglobin 8.5 (*)    HCT 27.0 (*)    All other components within normal limits  MRSA PCR SCREENING  LIPASE, BLOOD  PROTIME-INR  H. PYLORI ANTIBODY, IGG  TYPE AND SCREEN    EKG EKG Interpretation  Date/Time:  Tuesday March 09 2018 19:36:21 EDT Ventricular Rate:  87 PR Interval:    QRS Duration: 91 QT Interval:  375 QTC Calculation: 452 R Axis:   -58 Text Interpretation:  Sinus rhythm Left anterior fascicular block Abnormal R-wave progression, early transition Left ventricular hypertrophy Confirmed by Virgel Manifold [phone]) on 03/09/2018 8:03:36 PM   Radiology No results found.  Procedures Procedures (including critical care time)  CRITICAL CARE Performed by:  Virgel Manifold Total critical care time: 35 minutes Critical care time was exclusive of separately billable procedures and treating other patients. Critical care was necessary to treat or prevent imminent or life-threatening deterioration. Critical care was time spent personally by me on the following activities: development of treatment plan with patient and/or surrogate as well as nursing, discussions with consultants, evaluation of patient's response to treatment, examination of patient, obtaining history from patient or surrogate, ordering and performing treatments and interventions, ordering and review of laboratory studies, ordering and review of radiographic studies, pulse oximetry and re-evaluation  of patient's condition.   Medications Ordered in ED Medications  pantoprazole (PROTONIX) injection 80 mg (has no administration in time range)  sodium chloride 0.9 % bolus 1,000 mL (has no administration in time range)  ondansetron (ZOFRAN) injection 4 mg (has no administration in time range)     Initial Impression / Assessment and Plan / ED Course  I have reviewed the triage vital signs and the nursing notes.  Pertinent labs & imaging results that were available during my care of the patient were reviewed by me and considered in my medical decision making (see chart for details).     77 year old female with hematemesis.  She likely has a bleeding ulcer.  Initial H/H is not too bad, but this will drop. Type and screen. PPI. Two IVs established. Bolus IVF. Discussed with GI, Dr Oneida Alar.   Requesting that she be kept n.p.o. after midnight tonight.  Will be admitted to the hospitalist service.  Final Clinical Impressions(s) / ED Diagnoses   Final diagnoses:  Hematemesis with nausea  UGIB (upper gastrointestinal bleed)  Symptomatic anemia    ED Discharge Orders    None       Virgel Manifold, MD 03/14/18 [phone]

## 2018-03-09 NOTE — H&P (Signed)
H&P        History and Physical    BLYSS LUGAR ELF:[id number] DOB: 09-16-1940 DOA: 03/09/2018  PCP: Rosita Fire, MD   Patient coming from: Home  I have personally briefly reviewed patient's old medical records in [place]  Chief Complaint: hematemesis  HPI: Emily Nichols is a 77 y.o. female with medical history significant of 77 year old with a history of arthritis and COPD presents with hematemesis.  Patient had a bout at home and then came into the ED.  She had a large bout of hematemesis that was bright red per ED physician.  Patient denies any abdominal pain.  She denies any melena or hematochezia.  Over the past month she is been taking naproxen daily for her arthritis.  She has noticed some early satiety and some dyspepsia over the past month.  She is been taking Tums intermittently for this.   hemoglobin is stable today at 11. Denies  History of bleeding disorders or peptic ulcer in the past. s He does occasionally smoke cigarettes and has done so for the past 10 years or so.  Denies any alcohol use.   ED Course: ED physician Dr. Wilson Singer  spoke with GI Dr. Oneida Alar  who plans to do EGD in the morning.  Patient was given IV bolus of Protonix.  Review of Systems: +hematemesis dyspepsia arthritic pain All others reviewed and otherwise negative other than those above  per HPI   Past Medical History:  Diagnosis Date  . Arthritis   . Colon polyps   . COPD (chronic obstructive pulmonary disease) ([place])   . Seasonal allergies     Past Surgical History:  Procedure Laterality Date  . COLONOSCOPY N/A 04/07/2014   Procedure: COLONOSCOPY;  Surgeon: Danie Binder, MD;  [facility];  Service: Endoscopy;  Laterality: N/A;  8:30 AM  . COLONOSCOPY    . COLONOSCOPY N/A 09/18/2017   Procedure: COLONOSCOPY;  Surgeon: Danie Binder, MD;  [facility];  Service: Endoscopy;  Laterality: N/A;  2:00pm     reports that she has been smoking cigarettes.  She has  smoked for the past 10.00 years. She has never used smokeless tobacco. She reports that she does not drink alcohol or use drugs.  No Known Allergies  Family History  Problem Relation Age of Onset  . Colon cancer Neg Hx   . Colon polyps Neg Hx   No PUD or bleeding d/o    Prior to Admission medications   Medication Sig Start Date End Date Taking? Authorizing Provider  ADVAIR DISKUS 250-50 MCG/DOSE AEPB INHALE ONE PUFF BY MOUTH TWICE DAILY 11/19/15  Yes [provider]  Calcium Carbonate-Vitamin D (CALCIUM-VITAMIN D3 PO) Take 1 tablet by mouth daily.   Yes [provider]  fluticasone (FLONASE) 50 MCG/ACT nasal spray Place 2 sprays into both nostrils daily as needed for allergies.    Yes [provider]  loratadine (CLARITIN) 10 MG tablet Take 10 mg by mouth daily as needed for allergies.    Yes [provider]  montelukast (SINGULAIR) 10 MG tablet Take 10 mg by mouth every morning.    Yes [provider]  naproxen sodium (ALEVE) 220 MG tablet Take 440 mg by mouth daily as needed (for arthritis pain).    Yes [provider]    Physical Exam: Vitals:   03/09/18 2015 03/09/18 2030 03/09/18 2100 03/09/18 2200  BP:  97/69  94/64  Pulse: 70 64  68  Resp: 14 14  19   Temp:   (!) 97.4 F (36.3 C)   TempSrc:   Oral   SpO2: 99% 100%  96%  Weight:   64.8 kg (142 lb 13.7 oz)   Height:   5\' 11"  (1.803 m)     Constitutional: NAD, calm, comfortable Vitals:   03/09/18 2015 03/09/18 2030 03/09/18 2100 03/09/18 2200  BP:  97/69  94/64  Pulse: 70 64  68  Resp: 14 14  19   Temp:   (!) 97.4 F (36.3 C)   TempSrc:   Oral   SpO2: 99% 100%  96%  Weight:   64.8 kg (142 lb 13.7 oz)   Height:   5\' 11"  (1.803 m)    Eyes: PERRL, lids and conjunctivae normal ENMT: Mucous membranes are moist. Posterior pharynx clear of any exudate or lesions.poor  dentition.  Neck: normal, supple, no masses, no thyromegaly Respiratory: clear to auscultation  bilaterally, no wheezing, no crackles. Normal respiratory effort. No accessory muscle use.  Cardiovascular: Regular rate and rhythm, no murmurs / rubs / gallops. No extremity edema. 2+ pedal pulses. Abdomen: no tenderness, no masses palpated.  Bowel sounds positive.  Musculoskeletal: no clubbing / cyanosis.  Good ROM, no contractures.  Skin: no rashes, lesions, ulcers. No induration Neurologic: CN 2-12 grossly intact. Sensation intact, . Strength 5/5 in all 4.  Psychiatric: Normal judgment and insight. Alert and oriented x 3. Normal mood.   Labs on Admission: I have personally reviewed following labs and imagingstudies  CBC: Recent Labs  Lab 03/09/18 1816 03/09/18 2135  WBC 7.5  --   HGB 11.2* 9.5*  HCT 36.0 30.7*  MCV 85.1  --   PLT 350  --    Basic Metabolic Panel: Recent Labs  Lab 03/09/18 1816  NA 141  K 4.0  CL 107  CO2 28  GLUCOSE 104*  BUN 24*  CREATININE 0.91  CALCIUM 10.0   GFR: Estimated Creatinine Clearance: 52.1 mL/min (by C-G formula based on SCr of 0.91 mg/dL). Liver Function Tests: Recent Labs  Lab 03/09/18 1816  AST 20  ALT 14  ALKPHOS 73  BILITOT 0.6  PROT 7.4  ALBUMIN 3.8   Recent Labs  Lab 03/09/18 1816  LIPASE 29   No results for input(s): AMMONIA in the last 168 hours. Coagulation Profile: No results for input(s): INR, PROTIME in the last 168 hours. Cardiac Enzymes: No results for input(s): CKTOTAL, CKMB, CKMBINDEX, TROPONINI in the last 168 hours. BNP (last 3 results) No results for input(s): PROBNP in the last 8760 hours. HbA1C: No results for input(s): HGBA1C in the last 72 hours. CBG: No results for input(s): GLUCAP in the last 168 hours. Lipid Profile: No results for input(s): CHOL, HDL, LDLCALC, TRIG, CHOLHDL, LDLDIRECT in the last 72 hours. Thyroid Function Tests: No results for input(s): TSH, T4TOTAL, FREET4, T3FREE, THYROIDAB in the last 72 hours. Anemia Panel: No results for input(s): VITAMINB12, FOLATE, FERRITIN,  TIBC, IRON, RETICCTPCT in the last 72 hours. Urine analysis: No results found for: COLORURINE, APPEARANCEUR, LABSPEC, PHURINE, GLUCOSEU, HGBUR, BILIRUBINUR, KETONESUR, PROTEINUR, UROBILINOGEN, NITRITE, LEUKOCYTESUR  Radiological Exams on Admission: No results found.  EKG: Independently reviewed. SR  Assessment/Plan Principal Problem:   GIB (gastrointestinal bleeding) Active Problems:   Arthritis   COPD (chronic obstructive pulmonary disease) ([place])    -Inpt admit SDU, NPO, iv protonix bolus and gtt, serial h/h. Pt is consented for blood transfusion if needed, check coags , supportive care  -Dr Enzo Montgomery to see patient  in consult -D/c NSAIDs -Cont home inhaler, incentive spirometry    DVT prophylaxis: SCD Code Status: DNR Disposition Plan: suspect d/c home 2-3 days  Admission status: Inpt SDU   Magaly Pollina Johnson-Pitts MD [facility] Pager [phone]  If 7PM-7AM, please contact night-coverage [email or web address] Password [facility]  03/09/2018, 10:35 PM

## 2018-03-09 NOTE — ED Triage Notes (Signed)
Pt states when she eats she gets bloated.  Vomited x 1 today.

## 2018-03-10 ENCOUNTER — Encounter (HOSPITAL_COMMUNITY): Payer: Self-pay | Admitting: *Deleted

## 2018-03-10 ENCOUNTER — Encounter (HOSPITAL_COMMUNITY)
Admission: EM | Disposition: A | Discharge status: Home / Self Care | Payer: Self-pay | Source: Home / Self Care | Attending: Internal Medicine

## 2018-03-10 ENCOUNTER — Other Ambulatory Visit: Payer: Self-pay

## 2018-03-10 DIAGNOSIS — D62 Acute posthemorrhagic anemia: Secondary | ICD-10-CM | POA: Diagnosis present

## 2018-03-10 DIAGNOSIS — K920 Hematemesis: Secondary | ICD-10-CM

## 2018-03-10 HISTORY — PX: ESOPHAGOGASTRODUODENOSCOPY: SHX5428

## 2018-03-10 LAB — CBC
HCT: 27.3 % — ABNORMAL LOW (ref 36.0–46.0)
HCT: 29.2 % — ABNORMAL LOW (ref 36.0–46.0)
Hemoglobin: 8.2 g/dL — ABNORMAL LOW (ref 12.0–15.0)
Hemoglobin: 8.9 g/dL — ABNORMAL LOW (ref 12.0–15.0)
MCH: 26.3 pg (ref 26.0–34.0)
MCH: 26.4 pg (ref 26.0–34.0)
MCHC: 30 g/dL (ref 30.0–36.0)
MCHC: 30.5 g/dL (ref 30.0–36.0)
MCV: 87.5 fL (ref 78.0–100.0)
MCV: 86.6 fL (ref 78.0–100.0)
Platelets: 277 10*3/uL (ref 150–400)
Platelets: 292 10*3/uL (ref 150–400)
RBC: 3.12 MIL/uL — ABNORMAL LOW (ref 3.87–5.11)
RBC: 3.37 MIL/uL — ABNORMAL LOW (ref 3.87–5.11)
RDW: 14.9 % (ref 11.5–15.5)
RDW: 14.7 % (ref 11.5–15.5)
WBC: 6.3 10*3/uL (ref 4.0–10.5)
WBC: 6.2 10*3/uL (ref 4.0–10.5)

## 2018-03-10 LAB — MRSA PCR SCREENING: MRSA by PCR: NEGATIVE

## 2018-03-10 LAB — HEMOGLOBIN AND HEMATOCRIT, BLOOD
HCT: 26.9 % — ABNORMAL LOW (ref 36.0–46.0)
Hemoglobin: 8.4 g/dL — ABNORMAL LOW (ref 12.0–15.0)

## 2018-03-10 SURGERY — EGD (ESOPHAGOGASTRODUODENOSCOPY)
Anesthesia: Moderate Sedation

## 2018-03-10 MED ORDER — MIDAZOLAM HCL 5 MG/5ML IJ SOLN
INTRAMUSCULAR | Status: AC
  Filled 2018-03-10: qty 10

## 2018-03-10 MED ORDER — MIDAZOLAM HCL 5 MG/5ML IJ SOLN
INTRAMUSCULAR | Status: DC | PRN
  Administered 2018-03-10 (×3): 1 mg via INTRAVENOUS

## 2018-03-10 MED ORDER — MEPERIDINE HCL 50 MG/ML IJ SOLN
INTRAMUSCULAR | Status: DC | PRN
  Administered 2018-03-10: 25 mg via INTRAVENOUS

## 2018-03-10 MED ORDER — ALBUTEROL SULFATE (2.5 MG/3ML) 0.083% IN NEBU: 2.5000 mg | INHALATION_SOLUTION | RESPIRATORY_TRACT | Status: DC | PRN

## 2018-03-10 MED ORDER — LIDOCAINE VISCOUS HCL 2 % MT SOLN
OROMUCOSAL | Status: AC
  Filled 2018-03-10: qty 15

## 2018-03-10 MED ORDER — LIDOCAINE VISCOUS HCL 2 % MT SOLN
OROMUCOSAL | Status: DC | PRN
  Administered 2018-03-10: 4 mL via OROMUCOSAL

## 2018-03-10 MED ORDER — SUCRALFATE 1 GM/10ML PO SUSP
1.0000 g | Freq: Three times a day (TID) | ORAL | Status: DC
  Administered 2018-03-10 – 2018-03-11 (×4): 1 g via ORAL
  Filled 2018-03-10 (×5): qty 10

## 2018-03-10 MED ORDER — STERILE WATER FOR IRRIGATION IR SOLN
Status: DC | PRN
  Administered 2018-03-10: 15:00:00

## 2018-03-10 MED ORDER — SODIUM CHLORIDE 0.9 % IV SOLN: INTRAVENOUS | Status: DC

## 2018-03-10 MED ORDER — PANTOPRAZOLE SODIUM 40 MG IV SOLR
INTRAVENOUS | Status: AC
  Filled 2018-03-10: qty 80

## 2018-03-10 MED ORDER — MEPERIDINE HCL 50 MG/ML IJ SOLN
INTRAMUSCULAR | Status: AC
  Filled 2018-03-10: qty 1

## 2018-03-10 NOTE — Op Note (Signed)
[facility] Patient Name: Emily Nichols Procedure Date: 03/10/2018 3:00 PM MRN: [id number] Date of Birth: 01/15/1941 Attending MD: Hildred Laser , MD CSN: [id number] Age: 77 Admit Type: Inpatient Procedure:                Upper GI endoscopy Indications:              Acute post hemorrhagic anemia, Hematemesis Providers:                Hildred Laser, MD, Otis Peak B. Sharon Seller, RN, Randa Spike, Technician Referring MD:             Kathie Dike, MD Medicines:                Lidocaine spray, Meperidine 25 mg IV, Midazolam 3                            mg IV Complications:            No immediate complications. Estimated Blood Loss:     Estimated blood loss: none. Procedure:                Pre-Anesthesia Assessment:                           - Prior to the procedure, a History and Physical                            was performed, and patient medications and                            allergies were reviewed. The patient's tolerance of                            previous anesthesia was also reviewed. The risks                            and benefits of the procedure and the sedation                            options and risks were discussed with the patient.                            All questions were answered, and informed consent                            was obtained. Prior Anticoagulants: The patient                            last took naproxen 2 days prior to the procedure.                            ASA Grade Assessment: II - A patient with mild  systemic disease. After reviewing the risks and                            benefits, the patient was deemed in satisfactory                            condition to undergo the procedure.                           After obtaining informed consent, the endoscope was                            passed under direct vision. Throughout the                            procedure, the  patient's blood pressure, pulse, and                            oxygen saturations were monitored continuously. The                            [id number]-H190 ([id number]) was introduced through the                            mouth, and advanced to the second part of duodenum.                            The upper GI endoscopy was accomplished without                            difficulty. The patient tolerated the procedure                            well. Scope In: 3:20:08 PM Scope Out: 3:33:00 PM Total Procedure Duration: 0 hours 12 minutes 52 seconds  Findings:      The examined esophagus was normal.      The Z-line was irregular and was found 36 cm from the incisors.      A 2 cm hiatal hernia was present.      One non-bleeding cratered gastric ulcer with pigmented material was       found in the gastric body and on the anterior wall of the gastric body.       The lesion was 4 mm in largest dimension.      Patchy mild inflammation characterized by congestion (edema), erosions       and erythema was found in the gastric fundus, in the gastric body and in       the gastric antrum.      The duodenal bulb and second portion of the duodenum were normal. Impression:               - Normal esophagus.                           - Z-line irregular, 36 cm from the incisors.                           -  2 cm hiatal hernia.                           - Non-bleeding gastric ulcer with pigmented                            material.                           - Gastritis.                           - Normal duodenal bulb and second portion of the                            duodenum.                           - No specimens collected. Moderate Sedation:      Moderate (conscious) sedation was administered by the endoscopy nurse       and supervised by the endoscopist. The following parameters were       monitored: oxygen saturation, heart rate, blood pressure, CO2       capnography and response to care. Total  physician intraservice time was       17 minutes. Recommendation:           - Return patient to ICU for ongoing care.                           - Cardiac diet today.                           - Continue present medications.                           - No aspirin, ibuprofen, naproxen, or other                            non-steroidal anti-inflammatory drugs.                           - H.Pylori serology.                           - Sucralfate 1 g po qid during hospitalization.                           - Repeat upper endoscopy in 8 weeks by Dr. Oneida Alar                            to check healing. Procedure Code(s):        --- Professional ---                           [id number], Esophagogastroduodenoscopy, flexible,                            transoral; diagnostic, including collection of  specimen(s) by brushing or washing, when performed                            (separate procedure)                           G0500, Moderate sedation services provided by the                            same physician or other qualified health care                            professional performing a gastrointestinal                            endoscopic service that sedation supports,                            requiring the presence of an independent trained                            observer to assist in the monitoring of the                            patient's level of consciousness and physiological                            status; initial 15 minutes of intra-service time;                            patient age 1 years or older (additional time may                            be reported with [phone], as appropriate) Diagnosis Code(s):        --- Professional ---                           K22.8, Other specified diseases of esophagus                           K44.9, Diaphragmatic hernia without obstruction or                            gangrene                           K25.9,  Gastric ulcer, unspecified as acute or                            chronic, without hemorrhage or perforation                           K29.70, Gastritis, unspecified, without bleeding                           D62, Acute  posthemorrhagic anemia                           K92.0, Hematemesis CPT copyright 2017 American Medical Association. All rights reserved. The codes documented in this report are preliminary and upon coder review may  be revised to meet current compliance requirements. Hildred Laser, MD Hildred Laser, MD 03/10/2018 4:05:41 PM This report has been signed electronically. Number of Addenda: 0

## 2018-03-10 NOTE — Consult Note (Signed)
Reason for Consult:UGIB Referring Physician: Hospitalist.  Emily Nichols is an 77 y.o. female.  HPI: Admitted thru the ED yesterday. Started vomiting bright and dark red blood after returning home from a funeral. She says she has been taking Naproxen x 2 a day for her arthritic hands. Taking for about 2 months.States BMs have been normal color. She denies any abdominal pain.  Has had a drop in her hemoglobin from 11.2 to 8.4.     Retired from [employer]. Single. No children. Lives with brother. PCP Dr. Legrand Rams.  Presently NPO.  Hx of COPD.   Past Medical History:  Diagnosis Date  . Arthritis   . Colon polyps   . COPD (chronic obstructive pulmonary disease) ([place])   . Seasonal allergies     Past Surgical History:  Procedure Laterality Date  . COLONOSCOPY N/A 04/07/2014   Procedure: COLONOSCOPY;  Surgeon: Danie Binder, MD;  Location: [facility];  Service: Endoscopy;  Laterality: N/A;  8:30 AM  . COLONOSCOPY    . COLONOSCOPY N/A 09/18/2017   Procedure: COLONOSCOPY;  Surgeon: Danie Binder, MD;  Location: [facility];  Service: Endoscopy;  Laterality: N/A;  2:00pm    Family History  Problem Relation Age of Onset  . Colon cancer Neg Hx   . Colon polyps Neg Hx     Social History:  reports that she has been smoking cigarettes.  She has smoked for the past 10.00 years. She has never used smokeless tobacco. She reports that she does not drink alcohol or use drugs.  Allergies: No Known Allergies  Medications: I have reviewed the patient's current medications.  Results for orders placed or performed during the hospital encounter of 03/09/18 (from the past 48 hour(s))  Lipase, blood     Status: None   Collection Time: 03/09/18  6:16 PM  Result Value Ref Range   Lipase 29 11 - 51 U/L    Comment: Performed at [facility], [address], [place], [place] [address]  Comprehensive metabolic panel     Status: Abnormal   Collection Time: 03/09/18  6:16 PM  Result Value Ref Range    Sodium 141 135 - 145 mmol/L   Potassium 4.0 3.5 - 5.1 mmol/L   Chloride 107 98 - 111 mmol/L   CO2 28 22 - 32 mmol/L   Glucose, Bld 104 (H) 70 - 99 mg/dL   BUN 24 (H) 8 - 23 mg/dL   Creatinine, Ser 0.91 0.44 - 1.00 mg/dL   Calcium 10.0 8.9 - 10.3 mg/dL   Total Protein 7.4 6.5 - 8.1 g/dL   Albumin 3.8 3.5 - 5.0 g/dL   AST 20 15 - 41 U/L   ALT 14 0 - 44 U/L   Alkaline Phosphatase 73 38 - 126 U/L   Total Bilirubin 0.6 0.3 - 1.2 mg/dL   GFR calc non Af Amer 59 (L) >60 mL/min   GFR calc Af Amer >60 >60 mL/min    Comment: (NOTE) The eGFR has been calculated using the CKD EPI equation. This calculation has not been validated in all clinical situations. eGFR's persistently <60 mL/min signify possible Chronic Kidney Disease.    Anion gap 6 5 - 15    Comment: Performed at [facility], [address] [place], [place] [address]  CBC     Status: Abnormal   Collection Time: 03/09/18  6:16 PM  Result Value Ref Range   WBC 7.5 4.0 - 10.5 K/uL   RBC 4.23 3.87 -  5.11 MIL/uL   Hemoglobin 11.2 (L) 12.0 - 15.0 g/dL   HCT 36.0 36.0 - 46.0 %   MCV 85.1 78.0 - 100.0 fL   MCH 26.5 26.0 - 34.0 pg   MCHC 31.1 30.0 - 36.0 g/dL   RDW 14.8 11.5 - 15.5 %   Platelets 350 150 - 400 K/uL    Comment: Performed at [facility], [address], [place], [place] [address]  Protime-INR     Status: None   Collection Time: 03/09/18  6:16 PM  Result Value Ref Range   Prothrombin Time 13.5 11.4 - 15.2 seconds   INR 1.04     Comment: Performed at [facility], [address], [place], [place] [address]  Type and screen [facility]     Status: None   Collection Time: 03/09/18  7:45 PM  Result Value Ref Range   ABO/RH(D) O POS    Antibody Screen NEG    Sample Expiration      03/12/2018 Performed at [facility], [address], [place], [place] [address]   MRSA PCR Screening     Status: None   Collection Time: 03/09/18  9:03 PM  Result Value Ref Range   MRSA by PCR NEGATIVE NEGATIVE    Comment:         The GeneXpert MRSA Assay (FDA approved for NASAL specimens only), is one component of a comprehensive MRSA colonization surveillance program. It is not intended to diagnose MRSA infection nor to guide or monitor treatment for MRSA infections. Performed at [facility], [address], [place], [place] [address]   Hemoglobin and hematocrit, blood     Status: Abnormal   Collection Time: 03/09/18  9:35 PM  Result Value Ref Range   Hemoglobin 9.5 (L) 12.0 - 15.0 g/dL   HCT 30.7 (L) 36.0 - 46.0 %    Comment: Performed at [facility], [address], [place], [place] [address]  Hemoglobin and hematocrit, blood     Status: Abnormal   Collection Time: 03/10/18  4:03 AM  Result Value Ref Range   Hemoglobin 8.4 (L) 12.0 - 15.0 g/dL   HCT 26.9 (L) 36.0 - 46.0 %    Comment: Performed at [facility], [address] [place], [place] [address]    No results found.  ROS Blood pressure (!) 102/57, pulse 68, temperature 98.3 F (36.8 C), temperature source Oral, resp. rate 19, height 5' 11"  (1.803 m), weight 145 lb 1 oz (65.8 kg), SpO2 100 %. Physical Exam Alert and oriented. Skin warm and dry. Oral mucosa is moist.   . Sclera anicteric, conjunctivae is pink. Thyroid not enlarged. No cervical lymphadenopathy. Lungs clear. Heart regular rate and rhythm. Murmur heard.   Abdomen is soft. Bowel sounds are positive. No hepatomegaly. No abdominal masses felt. No tenderness.  No edema to lower extremities.    Assessment/Plan: UGI bleed. PUD needs to be ruled out given hx of taking Naproxen. Will discuss with Dr. Laural Golden.  Agree with Protonix 56m IV Terri L Setzer 03/10/2018, 8:08 AM

## 2018-03-10 NOTE — Progress Notes (Signed)
Unable to perform IS. Pt is asleep. IS left in room for morning instruction

## 2018-03-10 NOTE — OR Nursing (Signed)
Pt returning to floor by Lurline Del, rn and Aram Candela, endo tech; patient's purse still in bed with her.

## 2018-03-10 NOTE — Progress Notes (Signed)
PROGRESS NOTE    Emily Nichols  ZOX:[id number] DOB: 05-31-1941 DOA: 03/09/2018 PCP: Rosita Fire, MD    Brief Narrative:  77 year old female admitted to the hospital with hematemesis.  She has acute blood loss anemia which is being followed closely.  GI following  She will likely need to undergo endoscopy.  She is currently on Protonix infusion.  Blood pressures have been soft   Assessment & Plan:   Principal Problem:   GIB (gastrointestinal bleeding) Active Problems:   Arthritis   COPD (chronic obstructive pulmonary disease) (H[facility])   Acute blood loss anemia   1. GI bleeding, likely upper GI bleed.  Patient was taking NSAIDs prior to admission.  She started to have hematemesis.  She had one episode of vomiting overnight.  No bowel movement since admission.  Currently on Protonix infusion.  GI following. 2. Acute blood loss anemia.  Hemoglobin has trended down since admission from 11.2 yesterday to 8.4 this morning.  Repeat CBC is in process.  If continues to trend down, will need transfusion of PRBCs.  She did have some hypotension overnight, but blood pressure appears to be stable at this time.  She is not tachycardic.  She is sitting up in the chair does not appear to be symptomatic at this time. 3. COPD.  No evidence of shortness of breath wheezing at this time.  She is continued on [place].  We will continue bronchodilators as needed.   DVT prophylaxis: SCDs Code Status: DNR Family Communication: No family present Disposition Plan: Discharge home once improved   Consultants:   gastroenterology  Procedures:     Antimicrobials:       Subjective: She reports having one episode of vomiting overnight that contain blood.  She has not had any further episodes.  No bowel movements.  She was feeling lightheaded earlier when she vomited, but this is resolved now.  No chest pain or shortness of breath.  Objective: Vitals:   03/10/18 0500 03/10/18 0600 03/10/18 0700  03/10/18 0740  BP: (!) 81/54 (!) 81/57 (!) 102/57   Pulse: (!) 57 (!) 54 65 68  Resp: 17 16 17 19   Temp:    98.3 F (36.8 C)  TempSrc:    Oral  SpO2: 99% 100% 100% 100%  Weight:      Height:        Intake/Output Summary (Last 24 hours) at 03/10/2018 1042 Last data filed at 03/10/2018 0200 Gross per 24 hour  Intake 278.33 ml  Output -  Net 278.33 ml   Filed Weights   03/09/18 1752 03/09/18 2100 03/10/18 0414  Weight: 67.1 kg (148 lb) 64.8 kg (142 lb 13.7 oz) 65.8 kg (145 lb 1 oz)    Examination:  General exam: Appears calm and comfortable  Respiratory system: Clear to auscultation. Respiratory effort normal. Cardiovascular system: S1 & S2 heard, RRR. No JVD, murmurs, rubs, gallops or clicks. No pedal edema. Gastrointestinal system: Abdomen is nondistended, soft and nontender. No organomegaly or masses felt. Normal bowel sounds heard. Central nervous system: Alert and oriented. No focal neurological deficits. Extremities: Symmetric 5 x 5 power. Skin: No rashes, lesions or ulcers Psychiatry: Judgement and insight appear normal. Mood & affect appropriate.     Data Reviewed: I have personally reviewed following labs and imaging studies  CBC: Recent Labs  Lab 03/09/18 1816 03/09/18 2135 03/10/18 0403  WBC 7.5  --   --   HGB 11.2* 9.5* 8.4*  HCT 36.0 30.7* 26.9*  MCV 85.1  --   --  PLT 350  --   --    Basic Metabolic Panel: Recent Labs  Lab 03/09/18 1816  NA 141  K 4.0  CL 107  CO2 28  GLUCOSE 104*  BUN 24*  CREATININE 0.91  CALCIUM 10.0   GFR: Estimated Creatinine Clearance: 52.9 mL/min (by C-G formula based on SCr of 0.91 mg/dL). Liver Function Tests: Recent Labs  Lab 03/09/18 1816  AST 20  ALT 14  ALKPHOS 73  BILITOT 0.6  PROT 7.4  ALBUMIN 3.8   Recent Labs  Lab 03/09/18 1816  LIPASE 29   No results for input(s): AMMONIA in the last 168 hours. Coagulation Profile: Recent Labs  Lab 03/09/18 1816  INR 1.04   Cardiac Enzymes: No  results for input(s): CKTOTAL, CKMB, CKMBINDEX, TROPONINI in the last 168 hours. BNP (last 3 results) No results for input(s): PROBNP in the last 8760 hours. HbA1C: No results for input(s): HGBA1C in the last 72 hours. CBG: No results for input(s): GLUCAP in the last 168 hours. Lipid Profile: No results for input(s): CHOL, HDL, LDLCALC, TRIG, CHOLHDL, LDLDIRECT in the last 72 hours. Thyroid Function Tests: No results for input(s): TSH, T4TOTAL, FREET4, T3FREE, THYROIDAB in the last 72 hours. Anemia Panel: No results for input(s): VITAMINB12, FOLATE, FERRITIN, TIBC, IRON, RETICCTPCT in the last 72 hours. Sepsis Labs: No results for input(s): PROCALCITON, LATICACIDVEN in the last 168 hours.  Recent Results (from the past 240 hour(s))  MRSA PCR Screening     Status: None   Collection Time: 03/09/18  9:03 PM  Result Value Ref Range Status   MRSA by PCR NEGATIVE NEGATIVE Final    Comment:        The GeneXpert MRSA Assay (FDA approved for NASAL specimens only), is one component of a comprehensive MRSA colonization surveillance program. It is not intended to diagnose MRSA infection nor to guide or monitor treatment for MRSA infections. Performed at [facility], [address] [place],  [address]          Radiology Studies: No results found.      Scheduled Meds: . mometasone-formoterol  2 puff Inhalation BID  . [START ON 03/13/2018] pantoprazole  40 mg Intravenous Q12H   Continuous Infusions: . sodium chloride 75 mL/hr at 03/09/18 2230  . pantoprozole (PROTONIX) infusion 8 mg/hr (03/10/18 0540)     LOS: 1 day    Time spent: 62mins    Kathie Dike, MD [facility] Pager [phone]  If 7PM-7AM, please contact night-coverage [email or web address] Password [facility] 03/10/2018, 10:42 AM

## 2018-03-10 NOTE — Progress Notes (Signed)
Brief EGD note:  Small sliding hiatal hernia  4 mm deep gastric ulcer at gastric body along anterior wall. Erosive gastritis at body and antrum.

## 2018-03-11 ENCOUNTER — Telehealth: Payer: Self-pay | Admitting: Gastroenterology

## 2018-03-11 DIAGNOSIS — K449 Diaphragmatic hernia without obstruction or gangrene: Secondary | ICD-10-CM

## 2018-03-11 DIAGNOSIS — K2970 Gastritis, unspecified, without bleeding: Secondary | ICD-10-CM

## 2018-03-11 DIAGNOSIS — K920 Hematemesis: Secondary | ICD-10-CM

## 2018-03-11 DIAGNOSIS — M1990 Unspecified osteoarthritis, unspecified site: Secondary | ICD-10-CM

## 2018-03-11 DIAGNOSIS — K259 Gastric ulcer, unspecified as acute or chronic, without hemorrhage or perforation: Secondary | ICD-10-CM

## 2018-03-11 DIAGNOSIS — K250 Acute gastric ulcer with hemorrhage: Secondary | ICD-10-CM

## 2018-03-11 DIAGNOSIS — J449 Chronic obstructive pulmonary disease, unspecified: Secondary | ICD-10-CM

## 2018-03-11 DIAGNOSIS — K228 Other specified diseases of esophagus: Secondary | ICD-10-CM

## 2018-03-11 DIAGNOSIS — D62 Acute posthemorrhagic anemia: Secondary | ICD-10-CM

## 2018-03-11 DIAGNOSIS — K254 Chronic or unspecified gastric ulcer with hemorrhage: Secondary | ICD-10-CM

## 2018-03-11 LAB — CBC
HCT: 27 % — ABNORMAL LOW (ref 36.0–46.0)
Hemoglobin: 8.5 g/dL — ABNORMAL LOW (ref 12.0–15.0)
MCH: 27.2 pg (ref 26.0–34.0)
MCHC: 31.5 g/dL (ref 30.0–36.0)
MCV: 86.3 fL (ref 78.0–100.0)
Platelets: 276 10*3/uL (ref 150–400)
RBC: 3.13 MIL/uL — ABNORMAL LOW (ref 3.87–5.11)
RDW: 15.1 % (ref 11.5–15.5)
WBC: 5.6 10*3/uL (ref 4.0–10.5)

## 2018-03-11 MED ORDER — PANTOPRAZOLE SODIUM 40 MG PO TBEC: 40.0000 mg | DELAYED_RELEASE_TABLET | Freq: Two times a day (BID) | ORAL | 0 refills | Status: DC

## 2018-03-11 MED ORDER — PANTOPRAZOLE SODIUM 40 MG PO TBEC: 40.0000 mg | DELAYED_RELEASE_TABLET | Freq: Two times a day (BID) | ORAL | Status: DC

## 2018-03-11 MED ORDER — SUCRALFATE 1 GM/10ML PO SUSP: 1.0000 g | Freq: Three times a day (TID) | ORAL | 0 refills | Status: DC

## 2018-03-11 NOTE — Telephone Encounter (Signed)
Per CM, pt needs follow-up OV to schedule EGD.

## 2018-03-11 NOTE — Progress Notes (Signed)
Subjective:  No complaints. Wants to go home. No vomiting since admission. No BM.   Objective: Vital signs in last 24 hours: Temp:  [97.5 F (36.4 C)-98.4 F (36.9 C)] 97.9 F (36.6 C) (07/25 0720) Pulse Rate:  [54-68] 68 (07/25 0720) Resp:  [10-19] 14 (07/25 0720) BP: (81-114)/(44-79) 94/64 (07/25 0700) SpO2:  [97 %-100 %] 100 % (07/25 0720) Weight:  [146 lb 13.2 oz (66.6 kg)] 146 lb 13.2 oz (66.6 kg) (07/25 0500) Last BM Date: 03/08/18 General:   Alert,  Well-developed, well-nourished, pleasant and cooperative in NAD Head:  Normocephalic and atraumatic. Eyes:  Sclera clear, no icterus.  Abdomen:  Soft, nontender and nondistended.   Normal bowel sounds, without guarding, and without rebound.   Extremities:  Without clubbing, deformity or edema. Neurologic:  Alert and  oriented x4;  grossly normal neurologically. Skin:  Intact without significant lesions or rashes. Psych:  Alert and cooperative. Normal mood and affect.  Intake/Output from previous day: No intake/output data recorded. Intake/Output this shift: No intake/output data recorded.  Lab Results: CBC Recent Labs    03/10/18 0403 03/10/18 1144 03/11/18 0904  WBC 6.3 6.2 5.6  HGB 8.2*  8.4* 8.9* 8.5*  HCT 27.3*  26.9* 29.2* 27.0*  MCV 87.5 86.6 86.3  PLT 277 292 276   BMET Recent Labs    03/09/18 1816  NA 141  K 4.0  CL 107  CO2 28  GLUCOSE 104*  BUN 24*  CREATININE 0.91  CALCIUM 10.0   LFTs Recent Labs    03/09/18 1816  BILITOT 0.6  ALKPHOS 73  AST 20  ALT 14  PROT 7.4  ALBUMIN 3.8   Recent Labs    03/09/18 1816  LIPASE 29   PT/INR Recent Labs    03/09/18 1816  LABPROT 13.5  INR 1.04      Imaging Studies: No results found.[2 weeks]   Assessment:  77 y/o female presenting with hematemsis evening of 03/09/18. She denies abd pain but had been having early satiety. On naproxyn for two months for arthritic pain. Hgb 11.2 on presentation. Down to 8.2 yesterday. 8.5 today. She had  EGD yesterday showing nonbleeding cratered gastric ulcer, gastritis. Tolerating diet. Desires going home.   Plan: 1. Repeat EGD in 12 weeks.  2. Continue pantoprazole BID until EGD.  3. F/u on H.pylori serologies.  4. Avoid NSAIDS/ASA. 5. Dr. Oneida Alar to evaluate patient prior to decision regarding stability for discharge.   Laureen Ochs. Bernarda Caffey [facility] [phone] 7/25/201910:23 AM     LOS: 2 days

## 2018-03-11 NOTE — Telephone Encounter (Signed)
Ok to triage for EGD.

## 2018-03-11 NOTE — Discharge Instructions (Signed)
Please take protonix twice daily for 1 month, then take once daily for 3 months.  Please follow up with Dr. Oneida Alar in 3 months.  Please follow up with primary physician in 1 week.  USE TYLENOL AS NEEDED FOR ARTHRITIS. DON'T USE NAPROXEN OR IBUPROFEN OR GOODY POWDERS OR ALEVE.   Follow with Primary MD  Rosita Fire, MD  and other consultants as instructed your Hospitalist MD  Please get a complete blood count and chemistry panel checked by your Primary MD at your next visit, and again as instructed by your Primary MD.  Get Medicines reviewed and adjusted: Please take all your medications with you for your next visit with your Primary MD  Laboratory/radiological data: Please request your Primary MD to go over all hospital tests and procedure/radiological results at the follow up, please ask your Primary MD to get all Hospital records sent to his/her office.  In some cases, they will be blood work, cultures and biopsy results pending at the time of your discharge. Please request that your primary care M.D. follows up on these results.  Also Note the following: If you experience worsening of your admission symptoms, develop shortness of breath, life threatening emergency, suicidal or homicidal thoughts you must seek medical attention immediately by calling 911 or calling your MD immediately  if symptoms less severe.  You must read complete instructions/literature along with all the possible adverse reactions/side effects for all the Medicines you take and that have been prescribed to you. Take any new Medicines after you have completely understood and accpet all the possible adverse reactions/side effects.   Do not drive when taking Pain medications or sleeping medications (Benzodaizepines)  Do not take more than prescribed Pain, Sleep and Anxiety Medications. It is not advisable to combine anxiety,sleep and pain medications without talking with your primary care practitioner  Special  Instructions: If you have smoked or chewed Tobacco  in the last 2 yrs please stop smoking, stop any regular Alcohol  and or any Recreational drug use.  Wear Seat belts while driving.  Please note: You were cared for by a hospitalist during your hospital stay. Once you are discharged, your primary care physician will handle any further medical issues. Please note that NO REFILLS for any discharge medications will be authorized once you are discharged, as it is imperative that you return to your primary care physician (or establish a relationship with a primary care physician if you do not have one) for your post hospital discharge needs so that they can reassess your need for medications and monitor your lab values.    Gastrointestinal Bleeding Gastrointestinal bleeding is bleeding somewhere along the path food travels through the body (digestive tract). This path is anywhere between the mouth and the opening of the butt (anus). You may have blood in your poop (stools) or have black poop. If you throw up (vomit), there may be blood in it. This condition can be mild, serious, or even life-threatening. If you have a lot of bleeding, you may need to stay in the hospital. Follow these instructions at home:  Take over-the-counter and prescription medicines only as told by your doctor.  Eat foods that have a lot of fiber in them. These foods include whole grains, fruits, and vegetables. You can also try eating 1-3 prunes each day.  Drink enough fluid to keep your pee (urine) clear or pale yellow.  Keep all follow-up visits as told by your doctor. This is important. Contact a doctor if:  Your symptoms do not get better. Get help right away if:  Your bleeding gets worse.  You feel dizzy or you pass out (faint).  You feel weak.  You have very bad cramps in your back or belly (abdomen).  You pass large clumps of blood (clots) in your poop.  Your symptoms are getting worse. This information  is not intended to replace advice given to you by your health care provider. Make sure you discuss any questions you have with your health care provider. Document Released: 05/13/2008 Document Revised: 01/10/2016 Document Reviewed: 01/22/2015 Elsevier Interactive Patient Education  2018 [place].    Upper Gastrointestinal Bleeding Upper gastrointestinal (GI) bleeding is bleeding from the swallowing tube (esophagus), stomach, or the first part of the small intestine (duodenum). If you have upper GI bleeding, you may vomit blood or have bloody or black stools. Bleeding can range from mild to serious or even life-threatening. If there is a lot of bleeding, you may need to stay in the hospital. What are the causes? This condition may be caused by:  Ulcer disease of the stomach (peptic ulcer) or duodenum. This is the most common cause of GI bleeding.  Inflammation, irritation, or swelling of the esophagus (esophagitis).  A tear in the esophagus.  Cancer of the esophagus, stomach, or duodenum.  An abnormal or weakened blood vessel in one of the upper GI structures.  A bleeding disorder that impairs the formation of blood clots and causes easy bleeding (coagulopathy).  What increases the risk? The following factors may make you more likely to develop this condition:  Being older than 79 years of age.  Being female.  Having another long-term disease, especially liver or kidney disease.  Having a stomach infection caused by Helicobacter pylori bacteria.  Having frequent or severe vomiting.  Abusing alcohol.  Taking certain medicines for a long time, such as: ? NSAIDs. ? Anticoagulants.  What are the signs or symptoms? Symptoms of this condition include:  Vomiting blood.  Black or maroon-colored stools.  Bloody stools.  Weakness or dizziness.  Heartburn.  Abdominal pain.  Difficulty swallowing.  Weight loss.  Yellow eyes or skin (jaundice).  Racing  heartbeat.  How is this diagnosed? This condition may be diagnosed based on:  Your symptoms and medical history.  A physical exam. During the exam, your health care provider will check for signs of blood loss, such as low blood pressure and a rapid pulse.  Tests, such as: ? Blood tests to measure your blood cell count and to check for other signs of blood loss and clotting ability. ? Blood tests to check your liver and kidney function. ? A chest X-ray to look for a tear in the esophagus. ? Endoscopy. In this procedure, a flexible scope is put down your esophagus and into your stomach or duodenum to look for the source of bleeding. ? Angiogram. This may be done if the source of bleeding is not found during endoscopy. For an angiogram, X-rays are taken after a dye is injected into your bloodstream. ? Nasogastric tube insertion. This is a tube passed through your nose and down into your stomach. It may be connected to a source of gentle suction to see if any blood comes out.  How is this treated? Treatment for this condition depends on the cause of the bleeding. Active bleeding is treated at the hospital. Treatment may include:  Getting fluids through an IV tube inserted into one of your veins.  Getting blood through an  IV tube (blood transfusion).  Getting high doses of medicine through the IV to lower stomach acid. This may be done to treat ulcer disease.  Having endoscopy to treat an area of bleeding with high heat (coagulation), injections, or surgical clips.  Having a procedure that involves first doing an angiogram and then blocking blood flow to the bleeding site (embolization).  Stopping or changing some of your regular medicines for a certain amount of time.  Having other surgical procedures if initial treatments do not control bleeding.  Follow these instructions at home:  Take over-the-counter and prescription medicines only as told by your health care provider. You may  need to avoid NSAIDs or other medicines that increase bleeding.  Do not drink alcohol.  Drink enough fluid to keep your urine clear or pale yellow.  Follow instructions from your health care provider about eating or drinking restrictions.  Return to your normal activities as told by your health care provider. Ask your health care provider what activities are safe for you.  Do not use any tobacco products, such as cigarettes, chewing tobacco, and e-cigarettes. If you need help quitting, ask your health care provider.  Keep all follow-up visits as told by your health care provider. This is important. Contact a health care provider if:  You have abdominal pain or heartburn.  You have unexplained weight loss.  You have trouble swallowing.  You have frequent vomiting.  You develop jaundice.  You feel weak or dizzy.  You need help to stop smoking or drinking alcohol. Get help right away if:  You have vomiting with blood.  You have blood in your stools.  You have severe cramps in your back or abdomen.  Your symptoms of upper GI bleeding come back after treatment. This information is not intended to replace advice given to you by your health care provider. Make sure you discuss any questions you have with your health care provider. Document Released: 12/20/2015 Document Revised: 04/06/2016 Document Reviewed: 12/20/2015 Elsevier Interactive Patient Education  2018 [employer].   Esophagitis Esophagitis is inflammation of the esophagus. The esophagus is the tube that carries food and liquids from your mouth to your stomach. Esophagitis can cause soreness or pain in the esophagus. This condition can make it difficult and painful to swallow. What are the causes? Most causes of esophagitis are not serious. Common causes of this condition include:  Gastroesophageal reflux disease (GERD). This is when stomach contents move back up into the esophagus (reflux).  Repeated  vomiting.  An allergic-type reaction, especially caused by food allergies (eosinophilic esophagitis).  Injury to the esophagus by swallowing large pills with or without water, or swallowing certain types of medicines.  Swallowing (ingesting) harmful chemicals, such as household cleaning products.  Heavy alcohol use.  An infection of the esophagus.This most often occurs in people who have a weakened immune system.  Radiation or chemotherapy treatment for cancer.  Certain diseases such as sarcoidosis, Crohn disease, and scleroderma.  What are the signs or symptoms? Symptoms of this condition include:  Difficult or painful swallowing.  Pain with swallowing acidic liquids, such as citrus juices.  Pain with burping.  Chest pain.  Difficulty breathing.  Nausea.  Vomiting.  Pain in the abdomen.  Weight loss.  Ulcers in the mouth.  Patches of white material in the mouth (candidiasis).  Fever.  Coughing up blood or vomiting blood.  Stool that is black, tarry, or bright red.  How is this diagnosed? Your health care provider will  take a medical history and perform a physical exam. You may also have other tests, including:  An endoscopy to examine your stomach and esophagus with a small camera.  A test that measures the acidity level in your esophagus.  A test that measures how much pressure is on your esophagus.  A barium swallow or modified barium swallow to show the shape, size, and functioning of your esophagus.  Allergy tests.  How is this treated? Treatment for this condition depends on the cause of your esophagitis. In some cases, steroids or other medicines may be given to help relieve your symptoms or to treat the underlying cause of your condition. You may have to make some lifestyle changes, such as:  Avoiding alcohol.  Quitting smoking.  Changing your diet.  Exercising.  Changing your sleep habits and your sleep environment.  Follow these  instructions at home: Take these actions to decrease your discomfort and to help avoid complications. Diet  Follow a diet as recommended by your health care provider. This may involve avoiding foods and drinks such as: ? Coffee and tea (with or without caffeine). ? Drinks that contain alcohol. ? Energy drinks and sports drinks. ? Carbonated drinks or sodas. ? Chocolate and cocoa. ? Peppermint and mint flavorings. ? Garlic and onions. ? Horseradish. ? Spicy and acidic foods, including peppers, chili powder, curry powder, vinegar, hot sauces, and barbecue sauce. ? Citrus fruit juices and citrus fruits, such as oranges, lemons, and limes. ? Tomato-based foods, such as red sauce, chili, salsa, and pizza with red sauce. ? Fried and fatty foods, such as donuts, french fries, potato chips, and high-fat dressings. ? High-fat meats, such as hot dogs and fatty cuts of red and white meats, such as rib eye steak, sausage, ham, and bacon. ? High-fat dairy items, such as whole milk, butter, and cream cheese.  Eat small, frequent meals instead of large meals.  Avoid drinking large amounts of liquid with your meals.  Avoid eating meals during the 2-3 hours before bedtime.  Avoid lying down right after you eat.  Do not exercise right after you eat.  Avoid foods and drinks that seem to make your symptoms worse. General instructions  Pay attention to any changes in your symptoms.  Take over-the-counter and prescription medicines only as told by your health care provider. Do not take aspirin, ibuprofen, or other NSAIDs unless your health care provider told you to do so.  If you have trouble taking pills, use a pill splitter to decrease the size of the pill. This will decrease the chance of the pill getting stuck or injuring your esophagus on the way down. Also, drink water after you take a pill.  Do not use any tobacco products, including cigarettes, chewing tobacco, and e-cigarettes. If you need  help quitting, ask your health care provider.  Wear loose-fitting clothing. Do not wear anything tight around your waist that causes pressure on your abdomen.  Raise (elevate) the head of your bed about 6 inches (15 cm).  Try to reduce your stress, such as with yoga or meditation. If you need help reducing stress, ask your health care provider.  If you are overweight, reduce your weight to an amount that is healthy for you. Ask your health care provider for guidance about a safe weight loss goal.  Keep all follow-up visits as told by your health care provider. This is important. Contact a health care provider if:  You have new symptoms.  You have unexplained weight  loss.  You have difficulty swallowing, or it hurts to swallow.  You have wheezing or a persistent cough.  Your symptoms do not improve with treatment.  You have frequent heartburn for more than two weeks. Get help right away if:  You have severe pain in your arms, neck, jaw, teeth, or back.  You feel sweaty, dizzy, or light-headed.  You have chest pain or shortness of breath.  You vomit and your vomit looks like blood or coffee grounds.  Your stool is bloody or black.  You have a fever.  You cannot swallow, drink, or eat. This information is not intended to replace advice given to you by your health care provider. Make sure you discuss any questions you have with your health care provider. Document Released: 09/11/2004 Document Revised: 01/10/2016 Document Reviewed: 11/29/2014 Elsevier Interactive Patient Education  2018 [place].   Gastroesophageal Reflux Disease, Adult Normally, food travels down the esophagus and stays in the stomach to be digested. If a person has gastroesophageal reflux disease (GERD), food and stomach acid move back up into the esophagus. When this happens, the esophagus becomes sore and swollen (inflamed). Over time, GERD can make small holes (ulcers) in the lining of the  esophagus. Follow these instructions at home: Diet  Follow a diet as told by your doctor. You may need to avoid foods and drinks such as: ? Coffee and tea (with or without caffeine). ? Drinks that contain alcohol. ? Energy drinks and sports drinks. ? Carbonated drinks or sodas. ? Chocolate and cocoa. ? Peppermint and mint flavorings. ? Garlic and onions. ? Horseradish. ? Spicy and acidic foods, such as peppers, chili powder, curry powder, vinegar, hot sauces, and BBQ sauce. ? Citrus fruit juices and citrus fruits, such as oranges, lemons, and limes. ? Tomato-based foods, such as red sauce, chili, salsa, and pizza with red sauce. ? Fried and fatty foods, such as donuts, french fries, potato chips, and high-fat dressings. ? High-fat meats, such as hot dogs, rib eye steak, sausage, ham, and bacon. ? High-fat dairy items, such as whole milk, butter, and cream cheese.  Eat small meals often. Avoid eating large meals.  Avoid drinking large amounts of liquid with your meals.  Avoid eating meals during the 2-3 hours before bedtime.  Avoid lying down right after you eat.  Do not exercise right after you eat. General instructions  Pay attention to any changes in your symptoms.  Take over-the-counter and prescription medicines only as told by your doctor. Do not take aspirin, ibuprofen, or other NSAIDs unless your doctor says it is okay.  Do not use any tobacco products, including cigarettes, chewing tobacco, and e-cigarettes. If you need help quitting, ask your doctor.  Wear loose clothes. Do not wear anything tight around your waist.  Raise (elevate) the head of your bed about 6 inches (15 cm).  Try to lower your stress. If you need help doing this, ask your doctor.  If you are overweight, lose an amount of weight that is healthy for you. Ask your doctor about a safe weight loss goal.  Keep all follow-up visits as told by your doctor. This is important. Contact a doctor  if:  You have new symptoms.  You lose weight and you do not know why it is happening.  You have trouble swallowing, or it hurts to swallow.  You have wheezing or a cough that keeps happening.  Your symptoms do not get better with treatment.  You have a hoarse voice.  Get help right away if:  You have pain in your arms, neck, jaw, teeth, or back.  You feel sweaty, dizzy, or light-headed.  You have chest pain or shortness of breath.  You throw up (vomit) and your throw up looks like blood or coffee grounds.  You pass out (faint).  Your poop (stool) is bloody or black.  You cannot swallow, drink, or eat. This information is not intended to replace advice given to you by your health care provider. Make sure you discuss any questions you have with your health care provider. Document Released: 01/21/2008 Document Revised: 01/10/2016 Document Reviewed: 11/29/2014 Elsevier Interactive Patient Education  [employer].

## 2018-03-11 NOTE — Progress Notes (Signed)
Pt expressing that she wants to go home. MD paged and made aware  Fenris Cauble Rica Mote, RN

## 2018-03-11 NOTE — Discharge Summary (Addendum)
Physician Discharge Summary  Emily Nichols OEV:[id number] DOB: Jan 13, 1941 DOA: 03/09/2018  PCP: Rosita Fire, MD GI: [place]   Admit date: 03/09/2018 Discharge date: 03/11/2018  Admitted From: Home  Disposition: Home   Recommendations for Outpatient Follow-up:  1. Follow up with PCP in 1 weeks 2. Follow up with GI in 3 months 3. Please obtain BMP/CBC in one week  Discharge Condition: STABLE   CODE STATUS: DNR   Brief Hospitalization Summary: Please see all hospital notes, images, labs for full details of the hospitalization.  HPI: Emily Nichols is a 77 y.o. female with medical history significant of 77 year old with a history of arthritis and COPD presents with hematemesis.  Patient had a bout at home and then came into the ED.  She had a large bout of hematemesis that was bright red per ED physician.  Patient denies any abdominal pain.  She denies any melena or hematochezia.  Over the past month she is been taking naproxen daily for her arthritis.  She has noticed some early satiety and some dyspepsia over the past month.  She is been taking Tums intermittently for this.   hemoglobin is stable today at 11. Denies  History of bleeding disorders or peptic ulcer in the past. s He does occasionally smoke cigarettes and has done so for the past 10 years or so.  Denies any alcohol use.    ED Course: ED physician Dr. Wilson Singer  spoke with GI Dr. Oneida Alar  who plans to do EGD.  Patient was given IV bolus of Protonix and was on IV protonix infusion thru 03/11/18.  1. GI bleeding, likely from erosive gastritis and gastric ulcer.  CBC stable at 8.5.  GI recommending protonix 40 mg BID x 1 month, then daily x 3 months.  Follow up with Dr. Oneida Alar in 3 months recommended.  Carafate started by GI.   2. Acute blood loss anemia.  Hemoglobin has trended down.  Repeat CBC stable at 8.5. Ok to discharge home per GI.   3. COPD.  No evidence of shortness of breath wheezing at this time.  She is continued on  [place].  We will continue bronchodilators as needed. 4. Hypotension - BPs improved.   5. Osteoarthritis - dc naproxen, Use tylenol as needed for symptoms.   DVT prophylaxis: SCDs Code Status: DNR Family Communication: No family present Disposition Plan: Discharge home  Consultants:   gastroenterology  Procedures:   EGD 03/10/18 Brief EGD note:  Small sliding hiatal hernia  4 mm deep gastric ulcer at gastric body along anterior wall. Erosive gastritis at body and antrum.   Discharge Diagnoses:  Principal Problem:   GIB (gastrointestinal bleeding) Active Problems:   Arthritis   COPD (chronic obstructive pulmonary disease) (HCC)   Acute blood loss anemia   Acute gastric ulcer with hemorrhage  Discharge Instructions: Discharge Instructions    Call MD for:  difficulty breathing, headache or visual disturbances   Complete by:  As directed    Call MD for:  extreme fatigue   Complete by:  As directed    Call MD for:  persistant dizziness or light-headedness   Complete by:  As directed    Call MD for:  persistant nausea and vomiting   Complete by:  As directed    Call MD for:  severe uncontrolled pain   Complete by:  As directed    Increase activity slowly   Complete by:  As directed      Allergies as of 03/11/2018  No Known Allergies     Medication List    STOP taking these medications   naproxen sodium 220 MG tablet Commonly known as:  ALEVE     TAKE these medications   ADVAIR DISKUS 250-50 MCG/DOSE Aepb Generic drug:  Fluticasone-Salmeterol INHALE ONE PUFF BY MOUTH TWICE DAILY   CALCIUM-VITAMIN D3 PO Take 1 tablet by mouth daily.   fluticasone 50 MCG/ACT nasal spray Commonly known as:  FLONASE Place 2 sprays into both nostrils daily as needed for allergies.   loratadine 10 MG tablet Commonly known as:  CLARITIN Take 10 mg by mouth daily as needed for allergies.   montelukast 10 MG tablet Commonly known as:  SINGULAIR Take 10 mg by mouth every  morning.   pantoprazole 40 MG tablet Commonly known as:  PROTONIX Take 1 tablet (40 mg total) by mouth 2 (two) times daily. Start taking on:  03/12/2018   sucralfate 1 GM/10ML suspension Commonly known as:  CARAFATE Take 10 mLs (1 g total) by mouth 4 (four) times daily -  with meals and at bedtime.      Follow-up Information    Rosita Fire, MD. Schedule an appointment as soon as possible for a visit in 1 week(s).   Specialty:  Internal Medicine Contact information: [place]  [address] [phone]        Danie Binder, MD. Schedule an appointment as soon as possible for a visit in 3 month(s).   Specialty:  Gastroenterology Contact information: [place] [address] [phone]          No Known Allergies Allergies as of 03/11/2018   No Known Allergies     Medication List    STOP taking these medications   naproxen sodium 220 MG tablet Commonly known as:  ALEVE     TAKE these medications   ADVAIR DISKUS 250-50 MCG/DOSE Aepb Generic drug:  Fluticasone-Salmeterol INHALE ONE PUFF BY MOUTH TWICE DAILY   CALCIUM-VITAMIN D3 PO Take 1 tablet by mouth daily.   fluticasone 50 MCG/ACT nasal spray Commonly known as:  FLONASE Place 2 sprays into both nostrils daily as needed for allergies.   loratadine 10 MG tablet Commonly known as:  CLARITIN Take 10 mg by mouth daily as needed for allergies.   montelukast 10 MG tablet Commonly known as:  SINGULAIR Take 10 mg by mouth every morning.   pantoprazole 40 MG tablet Commonly known as:  PROTONIX Take 1 tablet (40 mg total) by mouth 2 (two) times daily. Start taking on:  03/12/2018   sucralfate 1 GM/10ML suspension Commonly known as:  CARAFATE Take 10 mLs (1 g total) by mouth 4 (four) times daily -  with meals and at bedtime.      Procedures/Studies:  No results found.   Subjective: Pt says she feels much better and wants to go home, tolerating diet and had  bowel movement  Discharge Exam: Vitals:   03/11/18 1500 03/11/18 1652  BP: 95/65   Pulse: 65   Resp: 14   Temp:  98.2 F (36.8 C)  SpO2: 100%    Vitals:   03/11/18 1300 03/11/18 1400 03/11/18 1500 03/11/18 1652  BP: 123/69 (!) 87/64 95/65   Pulse: 62 74 65   Resp: (!) 21 17 14    Temp:    98.2 F (36.8 C)  TempSrc:    Oral  SpO2: 97% 100% 100%   Weight:      Height:  General: Pt is alert, awake, not in acute distress Cardiovascular: RRR, S1/S2 +, no rubs, no gallops Respiratory: CTA bilaterally, no wheezing, no rhonchi Abdominal: Soft, NT, ND, bowel sounds + Extremities: no edema, no cyanosis   The results of significant diagnostics from this hospitalization (including imaging, microbiology, ancillary and laboratory) are listed below for reference.     Microbiology: Recent Results (from the past 240 hour(s))  MRSA PCR Screening     Status: None   Collection Time: 03/09/18  9:03 PM  Result Value Ref Range Status   MRSA by PCR NEGATIVE NEGATIVE Final    Comment:        The GeneXpert MRSA Assay (FDA approved for NASAL specimens only), is one component of a comprehensive MRSA colonization surveillance program. It is not intended to diagnose MRSA infection nor to guide or monitor treatment for MRSA infections. Performed at [facility], [address] [place], [place] [address]      Labs: BNP (last 3 results) No results for input(s): BNP in the last 8760 hours. Basic Metabolic Panel: Recent Labs  Lab 03/09/18 1816  NA 141  K 4.0  CL 107  CO2 28  GLUCOSE 104*  BUN 24*  CREATININE 0.91  CALCIUM 10.0   Liver Function Tests: Recent Labs  Lab 03/09/18 1816  AST 20  ALT 14  ALKPHOS 73  BILITOT 0.6  PROT 7.4  ALBUMIN 3.8   Recent Labs  Lab 03/09/18 1816  LIPASE 29   No results for input(s): AMMONIA in the last 168 hours. CBC: Recent Labs  Lab 03/09/18 1816 03/09/18 2135 03/10/18 0403 03/10/18 1144 03/11/18 0904  WBC 7.5  --  6.3  6.2 5.6  HGB 11.2* 9.5* 8.2*  8.4* 8.9* 8.5*  HCT 36.0 30.7* 27.3*  26.9* 29.2* 27.0*  MCV 85.1  --  87.5 86.6 86.3  PLT 350  --  277 292 276   Cardiac Enzymes: No results for input(s): CKTOTAL, CKMB, CKMBINDEX, TROPONINI in the last 168 hours. BNP: Invalid input(s): POCBNP CBG: No results for input(s): GLUCAP in the last 168 hours. D-Dimer No results for input(s): DDIMER in the last 72 hours. Hgb A1c No results for input(s): HGBA1C in the last 72 hours. Lipid Profile No results for input(s): CHOL, HDL, LDLCALC, TRIG, CHOLHDL, LDLDIRECT in the last 72 hours. Thyroid function studies No results for input(s): TSH, T4TOTAL, T3FREE, THYROIDAB in the last 72 hours.  Invalid input(s): FREET3 Anemia work up No results for input(s): VITAMINB12, FOLATE, FERRITIN, TIBC, IRON, RETICCTPCT in the last 72 hours. Urinalysis No results found for: COLORURINE, APPEARANCEUR, [place], [place], [place], [place], [place], [place], PROTEINUR, UROBILINOGEN, NITRITE, LEUKOCYTESUR Sepsis Labs Invalid input(s): PROCALCITONIN,  WBC,  LACTICIDVEN Microbiology Recent Results (from the past 240 hour(s))  MRSA PCR Screening     Status: None   Collection Time: 03/09/18  9:03 PM  Result Value Ref Range Status   MRSA by PCR NEGATIVE NEGATIVE Final    Comment:        The GeneXpert MRSA Assay (FDA approved for NASAL specimens only), is one component of a comprehensive MRSA colonization surveillance program. It is not intended to diagnose MRSA infection nor to guide or monitor treatment for MRSA infections. Performed at [facility], [address], [place],  [address]    Time coordinating discharge: 34 mins  SIGNED:  Irwin Brakeman, MD  [facility] 03/11/2018, 5:28 PM Pager [phone]  If 7PM-7AM, please contact night-coverage [email or web address] Password [email or web address]

## 2018-03-11 NOTE — Telephone Encounter (Signed)
Patient needs EGD with [facility] in 3 months for gastric ulcer. Triage if ok with Dr. Oneida Alar.

## 2018-03-11 NOTE — Progress Notes (Signed)
PROGRESS NOTE   Emily Nichols  TMH:[id number] DOB: 1941-08-17 DOA: 03/09/2018 PCP: Rosita Fire, MD    Brief Narrative:  77 year old female admitted to the hospital with hematemesis.  She has acute blood loss anemia which is being followed closely.  GI following  She did undergo endoscopy.  She is currently on Protonix infusion.  Blood pressures have been soft.   Assessment & Plan:   Principal Problem:   GIB (gastrointestinal bleeding) Active Problems:   Arthritis   COPD (chronic obstructive pulmonary disease) (HCC)   Acute blood loss anemia  1. GI bleeding, likely from erosive gastritis and gastric ulcer.  Recheck CBC this AM.  If stable, pt would like to go home.  Will see what GI is recommending.  2. Acute blood loss anemia.  Hemoglobin has trended down.  Repeat CBC is in process.  If stable, possible discharge.  3. COPD.  No evidence of shortness of breath wheezing at this time.  She is continued on [place].  We will continue bronchodilators as needed. 4. Hypotension - BPs slightly improved.    DVT prophylaxis: SCDs Code Status: DNR Family Communication: No family present Disposition Plan: Discharge home once improved  Consultants:   gastroenterology  Procedures:   EGD 03/10/18 Brief EGD note:  Small sliding hiatal hernia  4 mm deep gastric ulcer at gastric body along anterior wall. Erosive gastritis at body and antrum.  Subjective: Pt says she wants to go home.   Objective: Vitals:   03/11/18 0500 03/11/18 0600 03/11/18 0700 03/11/18 0720  BP: (!) 89/51 96/60 94/64    Pulse: 61 (!) 56 63 68  Resp: 19 13 15 14   Temp: 97.9 F (36.6 C)   97.9 F (36.6 C)  TempSrc: Oral   Oral  SpO2: 100% 100% 100% 100%  Weight: 66.6 kg (146 lb 13.2 oz)     Height:       No intake or output data in the 24 hours ending 03/11/18 0800 Filed Weights   03/09/18 2100 03/10/18 0414 03/11/18 0500  Weight: 64.8 kg (142 lb 13.7 oz) 65.8 kg (145 lb 1 oz) 66.6 kg (146 lb 13.2 oz)      Examination:  General exam: Appears calm and comfortable  Respiratory system: Clear to auscultation. Respiratory effort normal. Cardiovascular system: S1 & S2 heard, RRR. No JVD, murmurs, rubs, gallops or clicks. No pedal edema. Gastrointestinal system: Abdomen is nondistended, soft and nontender. No organomegaly or masses felt. Normal bowel sounds heard. Central nervous system: Alert and oriented. No focal neurological deficits. Extremities: Symmetric 5 x 5 power. Skin: No rashes, lesions or ulcers Psychiatry: Judgement and insight appear normal. Mood & affect appropriate.     Data Reviewed: I have personally reviewed following labs and imaging studies  CBC: Recent Labs  Lab 03/09/18 1816 03/09/18 2135 03/10/18 0403 03/10/18 1144  WBC 7.5  --  6.3 6.2  HGB 11.2* 9.5* 8.2*  8.4* 8.9*  HCT 36.0 30.7* 27.3*  26.9* 29.2*  MCV 85.1  --  87.5 86.6  PLT 350  --  277 921   Basic Metabolic Panel: Recent Labs  Lab 03/09/18 1816  NA 141  K 4.0  CL 107  CO2 28  GLUCOSE 104*  BUN 24*  CREATININE 0.91  CALCIUM 10.0   GFR: Estimated Creatinine Clearance: 53.6 mL/min (by C-G formula based on SCr of 0.91 mg/dL). Liver Function Tests: Recent Labs  Lab 03/09/18 1816  AST 20  ALT 14  ALKPHOS 73  BILITOT 0.6  PROT 7.4  ALBUMIN 3.8   Recent Labs  Lab 03/09/18 1816  LIPASE 29   No results for input(s): AMMONIA in the last 168 hours. Coagulation Profile: Recent Labs  Lab 03/09/18 1816  INR 1.04   Cardiac Enzymes: No results for input(s): CKTOTAL, CKMB, CKMBINDEX, TROPONINI in the last 168 hours. BNP (last 3 results) No results for input(s): PROBNP in the last 8760 hours. HbA1C: No results for input(s): HGBA1C in the last 72 hours. CBG: No results for input(s): GLUCAP in the last 168 hours. Lipid Profile: No results for input(s): CHOL, HDL, LDLCALC, TRIG, CHOLHDL, LDLDIRECT in the last 72 hours. Thyroid Function Tests: No results for input(s): TSH,  T4TOTAL, FREET4, T3FREE, THYROIDAB in the last 72 hours. Anemia Panel: No results for input(s): VITAMINB12, FOLATE, FERRITIN, TIBC, IRON, RETICCTPCT in the last 72 hours. Sepsis Labs: No results for input(s): PROCALCITON, LATICACIDVEN in the last 168 hours.  Recent Results (from the past 240 hour(s))  MRSA PCR Screening     Status: None   Collection Time: 03/09/18  9:03 PM  Result Value Ref Range Status   MRSA by PCR NEGATIVE NEGATIVE Final    Comment:        The GeneXpert MRSA Assay (FDA approved for NASAL specimens only), is one component of a comprehensive MRSA colonization surveillance program. It is not intended to diagnose MRSA infection nor to guide or monitor treatment for MRSA infections. Performed at [facility], [address] [place], [place] [address]    Radiology Studies: No results found.  Scheduled Meds: . mometasone-formoterol  2 puff Inhalation BID  . [START ON 03/13/2018] pantoprazole  40 mg Intravenous Q12H  . sucralfate  1 g Oral TID WC & HS   Continuous Infusions: . sodium chloride 75 mL/hr at 03/11/18 0319  . pantoprozole (PROTONIX) infusion 8 mg/hr (03/11/18 0514)     LOS: 2 days   Critical Care Time spent: 32 mins  Irwin Brakeman, MD [facility] Pager [phone]  If 7PM-7AM, please contact night-coverage [email or web address] Password [email or web address] 03/11/2018, 8:00 AM

## 2018-03-12 ENCOUNTER — Encounter: Payer: Self-pay | Admitting: Nurse Practitioner

## 2018-03-12 LAB — H. PYLORI ANTIBODY, IGG: H Pylori IgG: <0.8 Index Value (ref 0.00–0.79)

## 2018-03-15 NOTE — Telephone Encounter (Signed)
PATIENT SCHEDULED  °

## 2018-03-17 ENCOUNTER — Encounter (HOSPITAL_COMMUNITY): Payer: Self-pay | Admitting: Internal Medicine

## 2018-03-17 DIAGNOSIS — M1990 Unspecified osteoarthritis, unspecified site: Secondary | ICD-10-CM | POA: Diagnosis not present

## 2018-03-17 DIAGNOSIS — J45998 Other asthma: Secondary | ICD-10-CM | POA: Diagnosis not present

## 2018-03-17 DIAGNOSIS — J329 Chronic sinusitis, unspecified: Secondary | ICD-10-CM | POA: Diagnosis not present

## 2018-03-17 DIAGNOSIS — J309 Allergic rhinitis, unspecified: Secondary | ICD-10-CM | POA: Diagnosis not present

## 2018-03-17 DIAGNOSIS — Z79899 Other long term (current) drug therapy: Secondary | ICD-10-CM | POA: Diagnosis not present

## 2018-03-17 DIAGNOSIS — D62 Acute posthemorrhagic anemia: Secondary | ICD-10-CM | POA: Diagnosis not present

## 2018-03-17 DIAGNOSIS — J4520 Mild intermittent asthma, uncomplicated: Secondary | ICD-10-CM | POA: Diagnosis not present

## 2018-03-17 DIAGNOSIS — K922 Gastrointestinal hemorrhage, unspecified: Secondary | ICD-10-CM | POA: Diagnosis not present

## 2018-04-12 ENCOUNTER — Other Ambulatory Visit (HOSPITAL_COMMUNITY): Payer: Self-pay | Admitting: Internal Medicine

## 2018-04-12 DIAGNOSIS — Z1231 Encounter for screening mammogram for malignant neoplasm of breast: Secondary | ICD-10-CM

## 2018-04-13 ENCOUNTER — Telehealth: Payer: Self-pay | Admitting: Gastroenterology

## 2018-04-13 DIAGNOSIS — K922 Gastrointestinal hemorrhage, unspecified: Secondary | ICD-10-CM | POA: Diagnosis not present

## 2018-04-13 DIAGNOSIS — D62 Acute posthemorrhagic anemia: Secondary | ICD-10-CM | POA: Diagnosis not present

## 2018-04-13 NOTE — Telephone Encounter (Signed)
Dr. Oneida Alar, please advise how pt needs to take the Pantoprazole.

## 2018-04-13 NOTE — Telephone Encounter (Signed)
Pt has questions on how to take her prescription of pantoprazole.  She said that she was taking it twice a day for a month and then once a day for 3 months. She is confused on what the directions should be. Please advise and call her at [phone]

## 2018-04-14 ENCOUNTER — Other Ambulatory Visit: Payer: Self-pay | Admitting: Gastroenterology

## 2018-04-14 MED ORDER — PANTOPRAZOLE SODIUM 40 MG PO TBEC: DELAYED_RELEASE_TABLET | ORAL | 0 refills | Status: DC

## 2018-04-14 NOTE — Telephone Encounter (Signed)
PT is aware. She said that she needs a new prescription for once a day dosing so she doesn't get mixed up. She uses [facility] on [address], [place].

## 2018-04-14 NOTE — Addendum Note (Signed)
Addended by: Danie Binder on: 04/14/2018 04:20 PM   Modules accepted: Orders

## 2018-04-14 NOTE — Telephone Encounter (Signed)
PLEASE CALL PT. RX SENT FOR PROTONIX. TAKE 30 MINUTES PRIOR TO BREAKFAST UNTIL OCT 28.

## 2018-04-14 NOTE — Telephone Encounter (Signed)
PLEASE CALL PT. Pt should continue PROTONIX BID UNTIL AUG 28 AND THEN TAKE ONCE A DAY UNTIL  OCT 8. IF SHE DOES NOT HAVE A HISTORY OF HEARTBURN AND INDIGESTION MORE THAN 3-4 TIMES A WEEK THEN SHE CAN USE THE PROTONIX WHEN NEEDED AFTER

## 2018-04-14 NOTE — Telephone Encounter (Signed)
Pt is aware.  

## 2018-04-23 DIAGNOSIS — J4520 Mild intermittent asthma, uncomplicated: Secondary | ICD-10-CM | POA: Diagnosis not present

## 2018-04-23 DIAGNOSIS — D62 Acute posthemorrhagic anemia: Secondary | ICD-10-CM | POA: Diagnosis not present

## 2018-05-03 ENCOUNTER — Ambulatory Visit (HOSPITAL_COMMUNITY)
Admission: RE | Admit: 2018-05-03 | Discharge: 2018-05-03 | Disposition: A | Discharge status: Home / Self Care | Payer: Medicare Other | Source: Ambulatory Visit | Attending: Internal Medicine | Admitting: Internal Medicine

## 2018-05-03 DIAGNOSIS — Z1231 Encounter for screening mammogram for malignant neoplasm of breast: Secondary | ICD-10-CM | POA: Diagnosis not present

## 2018-05-25 DIAGNOSIS — J4520 Mild intermittent asthma, uncomplicated: Secondary | ICD-10-CM | POA: Diagnosis not present

## 2018-05-25 DIAGNOSIS — M1990 Unspecified osteoarthritis, unspecified site: Secondary | ICD-10-CM | POA: Diagnosis not present

## 2018-05-25 DIAGNOSIS — J329 Chronic sinusitis, unspecified: Secondary | ICD-10-CM | POA: Diagnosis not present

## 2018-05-25 DIAGNOSIS — D62 Acute posthemorrhagic anemia: Secondary | ICD-10-CM | POA: Diagnosis not present

## 2018-06-02 DIAGNOSIS — M79672 Pain in left foot: Secondary | ICD-10-CM | POA: Diagnosis not present

## 2018-06-02 DIAGNOSIS — I739 Peripheral vascular disease, unspecified: Secondary | ICD-10-CM | POA: Diagnosis not present

## 2018-06-02 DIAGNOSIS — L110 Acquired keratosis follicularis: Secondary | ICD-10-CM | POA: Diagnosis not present

## 2018-06-02 DIAGNOSIS — M79671 Pain in right foot: Secondary | ICD-10-CM | POA: Diagnosis not present

## 2018-06-20 NOTE — Progress Notes (Deleted)
Referring Provider: Rosita Fire, MD Primary Care Physician:  Rosita Fire, MD Primary GI:  Dr.   Rayne Du chief complaint on file.   HPI:   Emily Nichols is a 77 y.o. female who presents for posthospitalization follow-up and to schedule three-month EGD for peptic ulcer disease.  The patient was admitted to the hospital from 03/09/2018 through 03/11/2018 for complaints of hematemesis.  She had a bout of hematemesis at home and then had a large recurrence that was bright red per the ED physician, in the emergency department.  Denies abdominal pain, melena, hematochezia.  Has been taking naproxen daily for her arthritis over the previous months.  Hemoglobin stable at 11.2 initially.  Over the next 12 hours just declined to 9.5 and again to 8.4.  An upper endoscopy was completed and is outlined below.  Her hemoglobin stabilized at 8.9/8.5 and she was discharged in satisfactory condition.  Recommended 26-month repeat EGD.  EGD was completed during the hospitalization on 03/10/2018 which found normal esophagus, 2 cm hiatal hernia, single nonbleeding cratered gastric ulcer with pigmented material in the gastric body and on the anterior wall of the gastric body measuring 4 mm.  Also noted patchy mild inflammation characterized by edema, erosions, and erythema around the gastric fundus, and the gastric body, and in gastric antrum.  Normal duodenum.  Recommended strict avoidance of NSAIDs, H. pylori serology, Carafate, repeat EGD to document ulcer healing.  H. pylori serologies were negative.  She was discharged on Protonix 40 mg twice daily.  Today she states   Past Medical History:  Diagnosis Date  . Arthritis   . Colon polyps   . COPD (chronic obstructive pulmonary disease) ([place])   . Seasonal allergies     Past Surgical History:  Procedure Laterality Date  . COLONOSCOPY N/A 04/07/2014   Procedure: COLONOSCOPY;  Surgeon: Danie Binder, MD;  Location: [facility];  Service: Endoscopy;   Laterality: N/A;  8:30 AM  . COLONOSCOPY    . COLONOSCOPY N/A 09/18/2017   Procedure: COLONOSCOPY;  Surgeon: Danie Binder, MD;  Location: [facility];  Service: Endoscopy;  Laterality: N/A;  2:00pm  . ESOPHAGOGASTRODUODENOSCOPY N/A 03/10/2018   Procedure: ESOPHAGOGASTRODUODENOSCOPY (EGD);  Surgeon: Rogene Houston, MD;  Location: [facility];  Service: Endoscopy;  Laterality: N/A;    Current Outpatient Medications  Medication Sig Dispense Refill  . ADVAIR DISKUS 250-50 MCG/DOSE AEPB INHALE ONE PUFF BY MOUTH TWICE DAILY  3  . Calcium Carbonate-Vitamin D (CALCIUM-VITAMIN D3 PO) Take 1 tablet by mouth daily.    . fluticasone (FLONASE) 50 MCG/ACT nasal spray Place 2 sprays into both nostrils daily as needed for allergies.     Marland Kitchen loratadine (CLARITIN) 10 MG tablet Take 10 mg by mouth daily as needed for allergies.     . montelukast (SINGULAIR) 10 MG tablet Take 10 mg by mouth every morning.     . pantoprazole (PROTONIX) 40 MG tablet TAKE 1 TABLET BY MOUTH 30 MINUTES PRIOR TO THE FIRST MEAL UNTIL June 14, 2018 90 tablet 0  . sucralfate (CARAFATE) 1 GM/10ML suspension Take 10 mLs (1 g total) by mouth 4 (four) times daily -  with meals and at bedtime. 420 mL 0   No current facility-administered medications for this visit.     Allergies as of 06/21/2018  . (No Known Allergies)    Family History  Problem Relation Age of Onset  . Colon cancer Neg Hx   . Colon polyps Neg Hx  Social History   Socioeconomic History  . Marital status: Single    Spouse name: Not on file  . Number of children: Not on file  . Years of education: Not on file  . Highest education level: Not on file  Occupational History  . Not on file  Social Needs  . Financial resource strain: Not on file  . Food insecurity:    Worry: Not on file    Inability: Not on file  . Transportation needs:    Medical: Not on file    Non-medical: Not on file  Tobacco Use  . Smoking status: Current Some Day Smoker     Years: 10.00    Types: Cigarettes  . Smokeless tobacco: Never Used  . Tobacco comment: "Sometimes I just light up one and smoke it."  Substance and Sexual Activity  . Alcohol use: No  . Drug use: No  . Sexual activity: Not on file  Lifestyle  . Physical activity:    Days per week: Not on file    Minutes per session: Not on file  . Stress: Not on file  Relationships  . Social connections:    Talks on phone: Not on file    Gets together: Not on file    Attends religious service: Not on file    Active member of club or organization: Not on file    Attends meetings of clubs or organizations: Not on file    Relationship status: Not on file  Other Topics Concern  . Not on file  Social History Narrative  . Not on file    Review of Systems: General: Negative for anorexia, weight loss, fever, chills, fatigue, weakness. Eyes: Negative for vision changes.  ENT: Negative for hoarseness, difficulty swallowing , nasal congestion. CV: Negative for chest pain, angina, palpitations, dyspnea on exertion, peripheral edema.  Respiratory: Negative for dyspnea at rest, dyspnea on exertion, cough, sputum, wheezing.  GI: See history of present illness. GU:  Negative for dysuria, hematuria, urinary incontinence, urinary frequency, nocturnal urination.  MS: Negative for joint pain, low back pain.  Derm: Negative for rash or itching.  Neuro: Negative for weakness, abnormal sensation, seizure, frequent headaches, memory loss, confusion.  Psych: Negative for anxiety, depression, suicidal ideation, hallucinations.  Endo: Negative for unusual weight change.  Heme: Negative for bruising or bleeding. Allergy: Negative for rash or hives.   Physical Exam: There were no vitals taken for this visit. General:   Alert and oriented. Pleasant and cooperative. Well-nourished and well-developed.  Head:  Normocephalic and atraumatic. Eyes:  Without icterus, sclera clear and conjunctiva pink.  Ears:  Normal  auditory acuity. Mouth:  No deformity or lesions, oral mucosa pink.  Throat/Neck:  Supple, without mass or thyromegaly. Cardiovascular:  S1, S2 present without murmurs appreciated. Normal pulses noted. Extremities without clubbing or edema. Respiratory:  Clear to auscultation bilaterally. No wheezes, rales, or rhonchi. No distress.  Gastrointestinal:  +BS, soft, non-tender and non-distended. No HSM noted. No guarding or rebound. No masses appreciated.  Rectal:  Deferred  Musculoskalatal:  Symmetrical without gross deformities. Normal posture. Skin:  Intact without significant lesions or rashes. Neurologic:  Alert and oriented x4;  grossly normal neurologically. Psych:  Alert and cooperative. Normal mood and affect. Heme/Lymph/Immune: No significant cervical adenopathy. No excessive bruising noted.    06/20/2018 6:52 PM   Disclaimer: This note was dictated with voice recognition software. Similar sounding words can inadvertently be transcribed and may not be corrected upon review.

## 2018-06-21 ENCOUNTER — Ambulatory Visit: Payer: Medicare Other | Admitting: Nurse Practitioner

## 2018-06-22 ENCOUNTER — Ambulatory Visit (INDEPENDENT_AMBULATORY_CARE_PROVIDER_SITE_OTHER): Payer: Medicare Other | Admitting: Nurse Practitioner

## 2018-06-22 ENCOUNTER — Encounter: Payer: Self-pay | Admitting: Nurse Practitioner

## 2018-06-22 ENCOUNTER — Encounter: Payer: Self-pay | Admitting: *Deleted

## 2018-06-22 ENCOUNTER — Other Ambulatory Visit: Payer: Self-pay | Admitting: *Deleted

## 2018-06-22 VITALS — BP 99/65 | HR 75 | Temp 96.9°F | Ht 65.0 in | Wt 139.8 lb

## 2018-06-22 DIAGNOSIS — K254 Chronic or unspecified gastric ulcer with hemorrhage: Secondary | ICD-10-CM | POA: Diagnosis not present

## 2018-06-22 DIAGNOSIS — K279 Peptic ulcer, site unspecified, unspecified as acute or chronic, without hemorrhage or perforation: Secondary | ICD-10-CM | POA: Diagnosis not present

## 2018-06-22 NOTE — Patient Instructions (Signed)
1. Have your blood test completed when you are able to. 2. We will help schedule your upper endoscopy for you. 3. Avoid NSAIDs (ibuprofen, Motrin, Advil, Aleve, naproxen, Naprosyn, high-dose aspirin, Goody powder, BC powder, any medication with "NSAID" on the packaging).  It is okay to take Tylenol for arthritis pain. 4. Follow-up based on recommendations made after your repeat endoscopy. 5. Call us if you have any questions or concerns.  At [facility] we value your feedback. You may receive a survey about your visit today. Please share your experience as we strive to create trusting relationships with our patients to provide genuine, compassionate, quality care.  We appreciate your understanding and patience as we review any laboratory studies, imaging, and other diagnostic tests that are ordered as we care for you. Our office policy is 5 business days for review of these results, and any emergent or urgent results are addressed in a timely manner for your best interest. If you do not hear from our office in 1 week, please contact us.   We also encourage the use of MyChart, which contains your medical information for your review as well. If you are not enrolled in this feature, an access code is on this after visit summary for your convenience. Thank you for allowing [place] to be involved in your care.  It was great to see you today!  I hope you have a great Fall!!

## 2018-06-22 NOTE — Assessment & Plan Note (Signed)
The patient was taking over-the-counter arthritis medication (Naprosyn) and presented to the emergency department with hematemesis.  Endoscopy found a gastric ulcer for which she was placed on PPI.  She is completed her PPI course.  We will now plan to repeat her upper endoscopy to check for ulcer healing.  She denies any further abdominal pain, nausea, vomiting.  No GI symptoms.  Recommend she continue to avoid NSAIDs.  Follow-up based on post procedure recommendations.  Proceed with EGD with Dr. Oneida Alar in near future: the risks, benefits, and alternatives have been discussed with the patient in detail. The patient states understanding and desires to proceed.  The patient is not on any anticoagulants, anxiolytics, chronic pain medications, or antidepressants.  Conscious sedation should be adequate for her procedure.

## 2018-06-22 NOTE — Assessment & Plan Note (Signed)
The patient did have an acute GI bleed related to peptic ulcer disease.  This is primarily through hematemesis.  She denies any further hematemesis, melena, hematochezia.  She is avoiding NSAIDs and aspirin powders.  I reinforce this.  We will proceed with upper endoscopy as per above, follow-up based on post procedure recommendations.  We will recheck her CBC for hemoglobin status.

## 2018-06-22 NOTE — Progress Notes (Signed)
CC'D TO PCP °

## 2018-06-22 NOTE — Progress Notes (Signed)
Referring Provider: Rosita Fire, MD Primary Care Physician:  Rosita Fire, MD Primary GI:  Dr. Oneida Alar  Chief Complaint  Patient presents with  . gastric ulcer    doing ok    HPI:   Emily Nichols is a 77 y.o. female who presents on hospital for follow-up and to schedule upper endoscopy.  The patient was last seen in our office 09/03/2017 for history of adenomatous colon polyps and chronic medications/polypharmacy in order to schedule colonoscopy.  At that time she was doing well overall.  Essentially no GI symptoms.  Recommended colonoscopy, follow-up based on post procedure recommendations.  Colonoscopy was completed 09/18/2017 which found two 3 to 4 mm polyps of the hepatic flexure, two 3 to 5 mm polyps in the sigmoid colon and splenic flexure, one 6 mm polyp at the splenic flexure as well as a single nonbleeding colonic angiectasia.  Surgical pathology found the polyps to be a mix of tubular adenoma and hyperplastic polyps.  Recommended no further screening colonoscopy in the future given her age.  The patient was admitted to the hospital from 03/09/2018 through 03/11/2018 for GI bleed likely from erosive gastritis and gastric ulcer based on endoscopic findings.  Recommended Protonix 40 mg twice daily for a month and then daily for 3 months after discharge.  Follow-up with GI.  Start Carafate.  When she presented to the emergency department her hemoglobin was initially stable at 11.2 but then declined to 9.5, 8.4, 8.2.  Her hemoglobin at discharge was stable at 8.5.  H. pylori serologies were negative.  EGD completed 03/10/2018 which found normal esophagus, 2 cm hiatal hernia, nonbleeding gastric ulcer with pigmented material, gastritis.  Recommended repeat upper endoscopy in 2 to 3 months.  Today she states she's doing well overall. She has finished her PPI course. States she had an ulcer from taking too many arthritis medication. Denies any further abdominal pain, N/V, hematemesis,  hematochezia, melena. Is not taking any further NSAIDs. Denies chest pain, dyspnea, dizziness, lightheadedness, syncope, near syncope. Denies any other upper or lower GI symptoms.  Past Medical History:  Diagnosis Date  . Arthritis   . Colon polyps   . COPD (chronic obstructive pulmonary disease) ([place])   . Seasonal allergies     Past Surgical History:  Procedure Laterality Date  . COLONOSCOPY N/A 04/07/2014   Procedure: COLONOSCOPY;  Surgeon: Danie Binder, MD;  Location: [facility];  Service: Endoscopy;  Laterality: N/A;  8:30 AM  . COLONOSCOPY    . COLONOSCOPY N/A 09/18/2017   Procedure: COLONOSCOPY;  Surgeon: Danie Binder, MD;  Location: [facility];  Service: Endoscopy;  Laterality: N/A;  2:00pm  . ESOPHAGOGASTRODUODENOSCOPY N/A 03/10/2018   Procedure: ESOPHAGOGASTRODUODENOSCOPY (EGD);  Surgeon: Rogene [place], MD;  Location: [facility];  Service: Endoscopy;  Laterality: N/A;    Current Outpatient Medications  Medication Sig Dispense Refill  . ADVAIR DISKUS 250-50 MCG/DOSE AEPB INHALE ONE PUFF BY MOUTH TWICE DAILY  3  . Calcium Carbonate-Vitamin D (CALCIUM-VITAMIN D3 PO) Take 1 tablet by mouth daily.    . FEROSUL 325 (65 Fe) MG tablet Take 1 tablet by mouth 2 (two) times daily.  3  . fluticasone (FLONASE) 50 MCG/ACT nasal spray Place 2 sprays into both nostrils daily as needed for allergies.     . hydrochlorothiazide (MICROZIDE) 12.5 MG capsule Take 1 capsule by mouth as needed.  3  . loratadine (CLARITIN) 10 MG tablet Take 10 mg by mouth daily as needed  for allergies.     . montelukast (SINGULAIR) 10 MG tablet Take 10 mg by mouth every morning.      No current facility-administered medications for this visit.     Allergies as of 06/22/2018  . (No Known Allergies)    Family History  Problem Relation Age of Onset  . Colon cancer Neg Hx   . Colon polyps Neg Hx     Social History   Socioeconomic History  . Marital status: Single    Spouse name: Not on file    . Number of children: Not on file  . Years of education: Not on file  . Highest education level: Not on file  Occupational History  . Not on file  Social Needs  . Financial resource strain: Not on file  . Food insecurity:    Worry: Not on file    Inability: Not on file  . Transportation needs:    Medical: Not on file    Non-medical: Not on file  Tobacco Use  . Smoking status: Current Some Day Smoker    Years: 10.00    Types: Cigarettes  . Smokeless tobacco: Never Used  . Tobacco comment: "Sometimes I just light up one and smoke it."  Substance and Sexual Activity  . Alcohol use: No  . Drug use: No  . Sexual activity: Not on file  Lifestyle  . Physical activity:    Days per week: Not on file    Minutes per session: Not on file  . Stress: Not on file  Relationships  . Social connections:    Talks on phone: Not on file    Gets together: Not on file    Attends religious service: Not on file    Active member of club or organization: Not on file    Attends meetings of clubs or organizations: Not on file    Relationship status: Not on file  Other Topics Concern  . Not on file  Social History Narrative  . Not on file    Review of Systems: Complete ROS negative except as per HPI.   Physical Exam: BP 99/65   Pulse 75   Temp (!) 96.9 F (36.1 C) (Oral)   Ht 5\' 5"  (1.651 m)   Wt 139 lb 12.8 oz (63.4 kg)   BMI 23.26 kg/m  General:   Alert and oriented. Pleasant and cooperative. Well-nourished and well-developed.  Eyes:  Without icterus, sclera clear and conjunctiva pink.  Ears:  Normal auditory acuity. Cardiovascular:  S1, S2 present without murmurs appreciated. Extremities without clubbing or edema. Respiratory:  Clear to auscultation bilaterally. No wheezes, rales, or rhonchi. No distress.  Gastrointestinal:  +BS, soft, non-tender and non-distended. No HSM noted. No guarding or rebound. No masses appreciated.  Rectal:  Deferred  Musculoskalatal:  Symmetrical  without gross deformities. Skin:  Intact without significant lesions or rashes. Neurologic:  Alert and oriented x4;  grossly normal neurologically. Psych:  Alert and cooperative. Normal mood and affect. Heme/Lymph/Immune: No excessive bruising noted.    06/22/2018 8:52 AM   Disclaimer: This note was dictated with voice recognition software. Similar sounding words can inadvertently be transcribed and may not be corrected upon review.

## 2018-06-24 ENCOUNTER — Telehealth: Payer: Self-pay

## 2018-06-24 NOTE — Telephone Encounter (Signed)
Pt walked in office asking if she was suppose lab work. Pt was seen on 06/22/18 and EG wanted her to update lab work. Orders were given to pt that day. I explained to pt what was needed and she is going to go to [facility] lab where she feels more comfortable to have her blood work done.

## 2018-06-28 ENCOUNTER — Other Ambulatory Visit (HOSPITAL_COMMUNITY)
Admission: RE | Admit: 2018-06-28 | Discharge: 2018-06-28 | Disposition: A | Discharge status: Home / Self Care | Payer: Medicare Other | Source: Ambulatory Visit | Attending: Nurse Practitioner | Admitting: Nurse Practitioner

## 2018-06-28 DIAGNOSIS — K279 Peptic ulcer, site unspecified, unspecified as acute or chronic, without hemorrhage or perforation: Secondary | ICD-10-CM | POA: Diagnosis not present

## 2018-06-28 DIAGNOSIS — K254 Chronic or unspecified gastric ulcer with hemorrhage: Secondary | ICD-10-CM | POA: Diagnosis not present

## 2018-06-28 LAB — CBC WITH DIFFERENTIAL/PLATELET
Abs Immature Granulocytes: 0 10*3/uL (ref 0.00–0.07)
Basophils Absolute: 0 10*3/uL (ref 0.0–0.1)
Basophils Relative: 1 %
Eosinophils Absolute: 0.3 10*3/uL (ref 0.0–0.5)
Eosinophils Relative: 5 %
HCT: 41.2 % (ref 36.0–46.0)
Hemoglobin: 12.1 g/dL (ref 12.0–15.0)
Immature Granulocytes: 0 %
Lymphocytes Relative: 28 %
Lymphs Abs: 1.4 10*3/uL (ref 0.7–4.0)
MCH: 24.6 pg — ABNORMAL LOW (ref 26.0–34.0)
MCHC: 29.4 g/dL — ABNORMAL LOW (ref 30.0–36.0)
MCV: 83.9 fL (ref 80.0–100.0)
Monocytes Absolute: 0.7 10*3/uL (ref 0.1–1.0)
Monocytes Relative: 14 %
Neutro Abs: 2.6 10*3/uL (ref 1.7–7.7)
Neutrophils Relative %: 52 %
Platelets: 340 10*3/uL (ref 150–400)
RBC: 4.91 MIL/uL (ref 3.87–5.11)
RDW: 15.5 % (ref 11.5–15.5)
WBC: 4.9 10*3/uL (ref 4.0–10.5)
nRBC: 0 % (ref 0.0–0.2)

## 2018-07-08 NOTE — Progress Notes (Signed)
Wireless customer is not available. Try later.

## 2018-07-08 NOTE — Progress Notes (Signed)
Letter mailed to pt to call for lab results.

## 2018-07-09 DIAGNOSIS — J309 Allergic rhinitis, unspecified: Secondary | ICD-10-CM | POA: Diagnosis not present

## 2018-07-09 DIAGNOSIS — R040 Epistaxis: Secondary | ICD-10-CM | POA: Diagnosis not present

## 2018-07-22 ENCOUNTER — Telehealth: Payer: Self-pay

## 2018-07-22 DIAGNOSIS — K279 Peptic ulcer, site unspecified, unspecified as acute or chronic, without hemorrhage or perforation: Secondary | ICD-10-CM | POA: Diagnosis not present

## 2018-07-22 DIAGNOSIS — J309 Allergic rhinitis, unspecified: Secondary | ICD-10-CM | POA: Diagnosis not present

## 2018-07-22 DIAGNOSIS — J4520 Mild intermittent asthma, uncomplicated: Secondary | ICD-10-CM | POA: Diagnosis not present

## 2018-07-22 NOTE — Telephone Encounter (Signed)
PT received a letter and called for lab results. She also confirmed her appt for EGD.

## 2018-08-05 DIAGNOSIS — H2513 Age-related nuclear cataract, bilateral: Secondary | ICD-10-CM | POA: Diagnosis not present

## 2018-08-05 DIAGNOSIS — H18413 Arcus senilis, bilateral: Secondary | ICD-10-CM | POA: Diagnosis not present

## 2018-08-05 DIAGNOSIS — H524 Presbyopia: Secondary | ICD-10-CM | POA: Diagnosis not present

## 2018-08-05 DIAGNOSIS — H52221 Regular astigmatism, right eye: Secondary | ICD-10-CM | POA: Diagnosis not present

## 2018-08-12 DIAGNOSIS — H5213 Myopia, bilateral: Secondary | ICD-10-CM | POA: Diagnosis not present

## 2018-08-13 ENCOUNTER — Ambulatory Visit (HOSPITAL_COMMUNITY)
Admission: RE | Admit: 2018-08-13 | Discharge: 2018-08-13 | Disposition: A | Discharge status: Home / Self Care | Payer: Medicare Other | Attending: Gastroenterology | Admitting: Gastroenterology

## 2018-08-13 ENCOUNTER — Encounter (HOSPITAL_COMMUNITY): Payer: Self-pay | Admitting: *Deleted

## 2018-08-13 ENCOUNTER — Encounter (HOSPITAL_COMMUNITY)
Admission: RE | Disposition: A | Discharge status: Home / Self Care | Payer: Self-pay | Source: Home / Self Care | Attending: Gastroenterology

## 2018-08-13 ENCOUNTER — Other Ambulatory Visit: Payer: Self-pay

## 2018-08-13 DIAGNOSIS — K2950 Unspecified chronic gastritis without bleeding: Secondary | ICD-10-CM | POA: Insufficient documentation

## 2018-08-13 DIAGNOSIS — K279 Peptic ulcer, site unspecified, unspecified as acute or chronic, without hemorrhage or perforation: Secondary | ICD-10-CM | POA: Diagnosis not present

## 2018-08-13 DIAGNOSIS — K222 Esophageal obstruction: Secondary | ICD-10-CM | POA: Insufficient documentation

## 2018-08-13 DIAGNOSIS — Z79899 Other long term (current) drug therapy: Secondary | ICD-10-CM | POA: Insufficient documentation

## 2018-08-13 DIAGNOSIS — M1990 Unspecified osteoarthritis, unspecified site: Secondary | ICD-10-CM | POA: Insufficient documentation

## 2018-08-13 DIAGNOSIS — J449 Chronic obstructive pulmonary disease, unspecified: Secondary | ICD-10-CM | POA: Diagnosis not present

## 2018-08-13 DIAGNOSIS — K2970 Gastritis, unspecified, without bleeding: Secondary | ICD-10-CM | POA: Diagnosis not present

## 2018-08-13 DIAGNOSIS — K449 Diaphragmatic hernia without obstruction or gangrene: Secondary | ICD-10-CM

## 2018-08-13 DIAGNOSIS — F17210 Nicotine dependence, cigarettes, uncomplicated: Secondary | ICD-10-CM | POA: Diagnosis not present

## 2018-08-13 DIAGNOSIS — K254 Chronic or unspecified gastric ulcer with hemorrhage: Secondary | ICD-10-CM

## 2018-08-13 HISTORY — PX: ESOPHAGOGASTRODUODENOSCOPY: SHX5428

## 2018-08-13 HISTORY — PX: BIOPSY: SHX5522

## 2018-08-13 SURGERY — EGD (ESOPHAGOGASTRODUODENOSCOPY)
Anesthesia: Moderate Sedation

## 2018-08-13 MED ORDER — MEPERIDINE HCL 100 MG/ML IJ SOLN
INTRAMUSCULAR | Status: DC | PRN
  Administered 2018-08-13: 25 mg
  Administered 2018-08-13: 15 mg

## 2018-08-13 MED ORDER — LIDOCAINE VISCOUS HCL 2 % MT SOLN
OROMUCOSAL | Status: AC
  Filled 2018-08-13: qty 15

## 2018-08-13 MED ORDER — MEPERIDINE HCL 100 MG/ML IJ SOLN
INTRAMUSCULAR | Status: AC
  Filled 2018-08-13: qty 2

## 2018-08-13 MED ORDER — MIDAZOLAM HCL 5 MG/5ML IJ SOLN
INTRAMUSCULAR | Status: AC
  Filled 2018-08-13: qty 10

## 2018-08-13 MED ORDER — MIDAZOLAM HCL 5 MG/5ML IJ SOLN
INTRAMUSCULAR | Status: DC | PRN
  Administered 2018-08-13 (×2): 2 mg via INTRAVENOUS

## 2018-08-13 MED ORDER — STERILE WATER FOR IRRIGATION IR SOLN
Status: DC | PRN
  Administered 2018-08-13: 1.5 mL

## 2018-08-13 MED ORDER — SODIUM CHLORIDE 0.9 % IV SOLN
INTRAVENOUS | Status: DC
  Administered 2018-08-13: 12:00:00 via INTRAVENOUS

## 2018-08-13 NOTE — Op Note (Signed)
[facility] Patient Name: Emily Nichols Procedure Date: 08/13/2018 12:30 PM MRN: [id number] Date of Birth: July 20, 1941 Attending MD: Barney Drain MD, MD CSN: [id number] Age: 77 Admit Type: Outpatient Procedure:                Upper GI endoscopy WITH COLD FORCEPS BIOPSY Indications:              Follow-up of peptic ulcer Providers:                Barney Drain MD, MD, Lurline Del, RN, Gerome Sam, RN, Nelma Rothman, Technician Referring MD:             Rosita Fire MD, MD Medicines:                Meperidine 40 mg IV, Midazolam 4 mg IV Complications:            No immediate complications. Estimated Blood Loss:     Estimated blood loss was minimal. Procedure:                Pre-Anesthesia Assessment:                           - Prior to the procedure, a History and Physical                            was performed, and patient medications and                            allergies were reviewed. The patient's tolerance of                            previous anesthesia was also reviewed. The risks                            and benefits of the procedure and the sedation                            options and risks were discussed with the patient.                            All questions were answered, and informed consent                            was obtained. Prior Anticoagulants: The patient has                            taken no previous anticoagulant or antiplatelet                            agents. ASA Grade Assessment: II - A patient with                            mild systemic disease. After reviewing the risks  and benefits, the patient was deemed in                            satisfactory condition to undergo the procedure.                            After obtaining informed consent, the endoscope was                            passed under direct vision. Throughout the                            procedure, the  patient's blood pressure, pulse, and                            oxygen saturations were monitored continuously. The                            GIF-H190 ([id number]) scope was introduced through the                            mouth, and advanced to the second part of duodenum.                            The upper GI endoscopy was somewhat difficult due                            to the patient's agitation. The patient tolerated                            the procedure fairly well. Scope In: 1:02:19 PM Scope Out: 1:08:42 PM Total Procedure Duration: 0 hours 6 minutes 23 seconds  Findings:      One benign-appearing, intrinsic moderate (circumferential scarring or       stenosis; an endoscope may pass) stenosis was found. This stenosis       measured 1.4 cm (inner diameter). The stenosis was traversed.      A small hiatal hernia was present.      Patchy mild inflammation characterized by congestion (edema) and       erythema was found in the gastric body and in the gastric antrum.       Biopsies(2: body, 3: antrum) were taken with a cold forceps for       Helicobacter pylori testing.      The examined duodenum was normal. Impression:               - Benign-appearing esophageal stricture                           - Small hiatal hernia.                           - MILD Gastritis. Biopsied. Moderate Sedation:      Moderate (conscious) sedation was administered by the endoscopy nurse       and supervised by the endoscopist. The following parameters were  monitored: oxygen saturation, heart rate, blood pressure, and response       to care. Total physician intraservice time was 16 minutes. Recommendation:           - Patient has a contact number available for                            emergencies. The signs and symptoms of potential                            delayed complications were discussed with the                            patient. Return to normal activities tomorrow.                             Written discharge instructions were provided to the                            patient.                           - Resume previous diet.                           - Continue present medications.                           - Await pathology results.                           - Return to my office in 6 months. Procedure Code(s):        --- Professional ---                           [phone], Esophagogastroduodenoscopy, flexible,                            transoral; with biopsy, single or multiple                           G0500, Moderate sedation services provided by the                            same physician or other qualified health care                            professional performing a gastrointestinal                            endoscopic service that sedation supports,                            requiring the presence of an independent trained                            observer to assist in the  monitoring of the                            patient's level of consciousness and physiological                            status; initial 15 minutes of intra-service time;                            patient age 64 years or older (additional time may                            be reported with [phone], as appropriate) Diagnosis Code(s):        --- Professional ---                           K22.2, Esophageal obstruction                           K44.9, Diaphragmatic hernia without obstruction or                            gangrene                           K29.70, Gastritis, unspecified, without bleeding                           K27.9, Peptic ulcer, site unspecified, unspecified                            as acute or chronic, without hemorrhage or                            perforation CPT copyright 2018 American Medical Association. All rights reserved. The codes documented in this report are preliminary and upon coder review may  be revised to meet current compliance  requirements. Barney Drain, MD Barney Drain MD, MD 08/13/2018 1:30:23 PM This report has been signed electronically. Number of Addenda: 0

## 2018-08-13 NOTE — Discharge Instructions (Signed)
THE ULCER YOU HAD BEFORE IS GONE. YOU HAVE an esophageal stricture, a small hiatal hernia, and mild gastritis.  I biopsied your stomach.    DRINK WATER TO KEEP YOUR URINE LIGHT YELLOW.  FOLLOW A HIGH FIBER DIET. AVOID ITEMS THAT CAUSE BLOATING & GAS.  YOUR BIOPSY RESULTS WILL BE BACK IN 5 BUSINESS DAYS.  FOLLOW UP IN 6 MOS.    UPPER ENDOSCOPY AFTER CARE Read the instructions outlined below and refer to this sheet in the next week. These discharge instructions provide you with general information on caring for yourself after you leave the hospital. While your treatment has been planned according to the most current medical practices available, unavoidable complications occasionally occur. If you have any problems or questions after discharge, call DR. Sherrell Farish, [phone].  ACTIVITY  You may resume your regular activity, but move at a slower pace for the next 24 hours.   Take frequent rest periods for the next 24 hours.   Walking will help get rid of the air and reduce the bloated feeling in your belly (abdomen).   No driving for 24 hours (because of the medicine (anesthesia) used during the test).   You may shower.   Do not sign any important legal documents or operate any machinery for 24 hours (because of the anesthesia used during the test).    NUTRITION  Drink plenty of fluids.   You may resume your normal diet as instructed by your doctor.   Begin with a light meal and progress to your normal diet. Heavy or fried foods are harder to digest and may make you feel sick to your stomach (nauseated).   Avoid alcoholic beverages for 24 hours or as instructed.    MEDICATIONS  You may resume your normal medications.   WHAT YOU CAN EXPECT TODAY  Some feelings of bloating in the abdomen.   Passage of more gas than usual.    IF YOU HAD A BIOPSY TAKEN DURING THE UPPER ENDOSCOPY:  No aspirin products for 14 days.   Eat a soft diet IF YOU HAVE NAUSEA, BLOATING,  ABDOMINAL PAIN, OR VOMITING.    FINDING OUT THE RESULTS OF YOUR TEST Not all test results are available during your visit. DR. Oneida Alar WILL CALL YOU WITHIN 7 DAYS OF YOUR PROCEDUE WITH YOUR RESULTS. Do not assume everything is normal if you have not heard from DR. Tiki Tucciarone IN ONE WEEK, CALL HER OFFICE AT [phone].  SEEK IMMEDIATE MEDICAL ATTENTION AND CALL THE OFFICE: [phone] IF:  You have more than a spotting of blood in your stool.   Your belly is swollen (abdominal distention).   You are nauseated or vomiting.   You have a temperature over 101F.   You have abdominal pain or discomfort that is severe or gets worse throughout the day.  Ulcer Disease (Peptic Ulcer, Gastric Ulcer, Duodenal Ulcer) You have an ulcer. This may be in your stomach (gastric ulcer) or in the first part of your small bowel, the duodenum (duodenal ulcer). An ulcer is a break in the lining of the stomach or duodenum. The ulcer causes erosion into the deeper tissue.  CAUSES The stomach has a lining to protect itself from the acid that digests food. The lining can be damaged in two main ways:  The Helico Pylori bacteria (H. Pyolori) can infect the lining of the stomach and cause ulcers.   ASPIRIN/Nonsteroidal, anti-inflammatory medications (NSAIDS) can cause gastric ulcerations.   Smoking tobacco can increase the acid in the stomach. This  can lead to ulcers, and will impair healing of ulcers.  Other factors, such as alcohol use and stress may contribute to ulcer formation. Rarely, a tumor or cancer can cause an ulcer.   SYMPTOMS The problems (symptoms) of ulcer disease are usually a burning or gnawing of the mid-upper belly (abdomen). This is often worse on an empty stomach and may get better with food. This may be associated with feeling sick to your stomach (nausea), bloating, and vomiting. If the ulcer results in bleeding, it can cause:  Black, tarry stools.   Vomiting of bright red blood.    Vomiting coffee-ground-looking materials.   SEVERE ANEMIA   HOME CARE INSTRUCTIONS Continue regular work and usual activities unless advised otherwise by your caregiver.  Avoid tobacco, alcohol, and caffeine. Tobacco use will decrease and slow the rates of healing.   Avoid foods that seem to aggravate or cause discomfort.   There are many over-the-counter products available to control stomach acid and other symptoms.   Special diets are not usually needed.   Keep any follow-up appointments and blood tests, as directed.

## 2018-08-13 NOTE — OR Nursing (Signed)
Left message with Dr Nona Dell office that patient needs a 6 month follow up in her office

## 2018-08-13 NOTE — H&P (Signed)
Primary Care Physician:  Rosita Fire, MD Primary Gastroenterologist:  Dr. Oneida Alar  Pre-Procedure History & Physical: HPI:  Emily Nichols is a 77 y.o. female here for peptic ulcer disease-EGD to assses healing.  Past Medical History:  Diagnosis Date  . Arthritis   . Colon polyps   . COPD (chronic obstructive pulmonary disease) ([place])   . Seasonal allergies     Past Surgical History:  Procedure Laterality Date  . COLONOSCOPY N/A 04/07/2014   Procedure: COLONOSCOPY;  Surgeon: Danie Binder, MD;  [facility];  Service: Endoscopy;  Laterality: N/A;  8:30 AM  . COLONOSCOPY    . COLONOSCOPY N/A 09/18/2017   Procedure: COLONOSCOPY;  Surgeon: Danie Binder, MD;  [facility];  Service: Endoscopy;  Laterality: N/A;  2:00pm  . ESOPHAGOGASTRODUODENOSCOPY N/A 03/10/2018   Procedure: ESOPHAGOGASTRODUODENOSCOPY (EGD);  Surgeon: Rogene Houston, MD;  [facility];  Service: Endoscopy;  Laterality: N/A;    Prior to Admission medications   Medication Sig Start Date End Date Taking? Authorizing Provider  ADVAIR DISKUS 250-50 MCG/DOSE AEPB INHALE ONE PUFF BY MOUTH TWICE DAILY 11/19/15  Yes [provider]  Calcium Carb-Cholecalciferol (CALCIUM 600+D3 PO) Take 1 tablet by mouth daily.   Yes [provider]  docusate sodium (COLACE) 100 MG capsule Take 100 mg by mouth 2 (two) times daily as needed for mild constipation.   Yes [provider]  fluticasone (FLONASE) 50 MCG/ACT nasal spray Place 2 sprays into both nostrils daily as needed for allergies.    Yes [provider]  hydrochlorothiazide (MICROZIDE) 12.5 MG capsule Take 12.5 mg by mouth daily as needed (foot swelling.).  04/15/18  Yes [provider]  loratadine (CLARITIN) 10 MG tablet Take 10 mg by mouth daily as needed for allergies.    Yes [provider]  montelukast (SINGULAIR) 10 MG tablet Take 10 mg by mouth daily.    Yes [provider]     Allergies as of 06/22/2018  . (No Known Allergies)    Family History  Problem Relation Age of Onset  . Colon cancer Neg Hx   . Colon polyps Neg Hx     Social History   Socioeconomic History  . Marital status: Single    Spouse name: Not on file  . Number of children: Not on file  . Years of education: Not on file  . Highest education level: Not on file  Occupational History  . Not on file  Social Needs  . Financial resource strain: Not on file  . Food insecurity:    Worry: Not on file    Inability: Not on file  . Transportation needs:    Medical: Not on file    Non-medical: Not on file  Tobacco Use  . Smoking status: Current Some Day Smoker    Years: 10.00    Types: Cigarettes  . Smokeless tobacco: Never Used  . Tobacco comment: "Sometimes I just light up one and smoke it."  Substance and Sexual Activity  . Alcohol use: No  . Drug use: No  . Sexual activity: Not on file  Lifestyle  . Physical activity:    Days per week: Not on file    Minutes per session: Not on file  . Stress: Not on file  Relationships  . Social connections:    Talks on phone: Not on file    Gets together: Not on file    Attends religious service: Not on file  Active member of club or organization: Not on file    Attends meetings of clubs or organizations: Not on file    Relationship status: Not on file  . Intimate partner violence:    Fear of current or ex partner: Not on file    Emotionally abused: Not on file    Physically abused: Not on file    Forced sexual activity: Not on file  Other Topics Concern  . Not on file  Social History Narrative  . Not on file    Review of Systems: See HPI, otherwise negative ROS   Physical Exam: BP 100/72   Pulse 73   Temp 97.8 F (36.6 C) (Oral)   Resp (!) 22   Ht 5\' 5"  (1.651 m)   Wt 63 kg   SpO2 100%   BMI 23.13 kg/m  General:   Alert,  pleasant and cooperative in NAD Head:  Normocephalic and atraumatic. Neck:   Supple; Lungs:  Clear throughout to auscultation.    Heart:  Regular rate and rhythm. Abdomen:  Soft, nontender and nondistended. Normal bowel sounds, without guarding, and without rebound.   Neurologic:  Alert and  oriented x4;  grossly normal neurologically.  Impression/Plan:     PUD  PLAN:  REPEAT EGD TO CONFIRM ULCERS ARE HEALED. DISCUSSED PROCEDURE, BENEFITS, & RISKS: < 1% chance of medication reaction, bleeding, OR perforation.

## 2018-08-19 ENCOUNTER — Encounter (HOSPITAL_COMMUNITY): Payer: Self-pay | Admitting: Gastroenterology

## 2018-08-23 ENCOUNTER — Encounter: Payer: Self-pay | Admitting: Nurse Practitioner

## 2018-08-23 NOTE — Progress Notes (Signed)
Tried to call and unavailable.

## 2018-08-23 NOTE — Progress Notes (Signed)
SCHEDULED AND LETTER SENT  °

## 2018-08-24 NOTE — Progress Notes (Signed)
Letter mailed to pt to call.  

## 2018-08-30 ENCOUNTER — Telehealth: Payer: Self-pay

## 2018-08-30 NOTE — Telephone Encounter (Signed)
Pt called and was informed of her results from procedure. She requested a high fiber diet and I am mailing her one.

## 2018-09-02 DIAGNOSIS — M79672 Pain in left foot: Secondary | ICD-10-CM | POA: Diagnosis not present

## 2018-09-02 DIAGNOSIS — I739 Peripheral vascular disease, unspecified: Secondary | ICD-10-CM | POA: Diagnosis not present

## 2018-09-02 DIAGNOSIS — L110 Acquired keratosis follicularis: Secondary | ICD-10-CM | POA: Diagnosis not present

## 2018-09-02 DIAGNOSIS — M79671 Pain in right foot: Secondary | ICD-10-CM | POA: Diagnosis not present

## 2018-09-10 DIAGNOSIS — H524 Presbyopia: Secondary | ICD-10-CM | POA: Diagnosis not present

## 2018-09-10 DIAGNOSIS — H52221 Regular astigmatism, right eye: Secondary | ICD-10-CM | POA: Diagnosis not present

## 2018-10-21 DIAGNOSIS — M1990 Unspecified osteoarthritis, unspecified site: Secondary | ICD-10-CM | POA: Diagnosis not present

## 2018-10-21 DIAGNOSIS — J4520 Mild intermittent asthma, uncomplicated: Secondary | ICD-10-CM | POA: Diagnosis not present

## 2018-10-21 DIAGNOSIS — J329 Chronic sinusitis, unspecified: Secondary | ICD-10-CM | POA: Diagnosis not present

## 2018-11-04 DIAGNOSIS — L110 Acquired keratosis follicularis: Secondary | ICD-10-CM | POA: Diagnosis not present

## 2018-11-04 DIAGNOSIS — M79671 Pain in right foot: Secondary | ICD-10-CM | POA: Diagnosis not present

## 2018-11-04 DIAGNOSIS — M79672 Pain in left foot: Secondary | ICD-10-CM | POA: Diagnosis not present

## 2018-11-04 DIAGNOSIS — I739 Peripheral vascular disease, unspecified: Secondary | ICD-10-CM | POA: Diagnosis not present

## 2018-11-05 DIAGNOSIS — J309 Allergic rhinitis, unspecified: Secondary | ICD-10-CM | POA: Diagnosis not present

## 2018-11-05 DIAGNOSIS — Z0001 Encounter for general adult medical examination with abnormal findings: Secondary | ICD-10-CM | POA: Diagnosis not present

## 2018-11-05 DIAGNOSIS — D62 Acute posthemorrhagic anemia: Secondary | ICD-10-CM | POA: Diagnosis not present

## 2018-11-05 DIAGNOSIS — J4520 Mild intermittent asthma, uncomplicated: Secondary | ICD-10-CM | POA: Diagnosis not present

## 2018-11-10 DIAGNOSIS — M1990 Unspecified osteoarthritis, unspecified site: Secondary | ICD-10-CM | POA: Diagnosis not present

## 2018-11-10 DIAGNOSIS — D62 Acute posthemorrhagic anemia: Secondary | ICD-10-CM | POA: Diagnosis not present

## 2018-11-10 DIAGNOSIS — E785 Hyperlipidemia, unspecified: Secondary | ICD-10-CM | POA: Diagnosis not present

## 2018-11-10 DIAGNOSIS — Z0001 Encounter for general adult medical examination with abnormal findings: Secondary | ICD-10-CM | POA: Diagnosis not present

## 2018-11-10 DIAGNOSIS — J4520 Mild intermittent asthma, uncomplicated: Secondary | ICD-10-CM | POA: Diagnosis not present

## 2018-12-30 DIAGNOSIS — I739 Peripheral vascular disease, unspecified: Secondary | ICD-10-CM | POA: Diagnosis not present

## 2018-12-30 DIAGNOSIS — M79671 Pain in right foot: Secondary | ICD-10-CM | POA: Diagnosis not present

## 2018-12-30 DIAGNOSIS — M79672 Pain in left foot: Secondary | ICD-10-CM | POA: Diagnosis not present

## 2018-12-30 DIAGNOSIS — L110 Acquired keratosis follicularis: Secondary | ICD-10-CM | POA: Diagnosis not present

## 2019-02-01 DIAGNOSIS — K922 Gastrointestinal hemorrhage, unspecified: Secondary | ICD-10-CM | POA: Diagnosis not present

## 2019-02-01 DIAGNOSIS — J4520 Mild intermittent asthma, uncomplicated: Secondary | ICD-10-CM | POA: Diagnosis not present

## 2019-02-01 DIAGNOSIS — D62 Acute posthemorrhagic anemia: Secondary | ICD-10-CM | POA: Diagnosis not present

## 2019-02-01 DIAGNOSIS — J329 Chronic sinusitis, unspecified: Secondary | ICD-10-CM | POA: Diagnosis not present

## 2019-02-21 ENCOUNTER — Encounter: Payer: Self-pay | Admitting: Nurse Practitioner

## 2019-02-21 ENCOUNTER — Ambulatory Visit (INDEPENDENT_AMBULATORY_CARE_PROVIDER_SITE_OTHER): Payer: Medicare Other | Admitting: Nurse Practitioner

## 2019-02-21 ENCOUNTER — Other Ambulatory Visit: Payer: Self-pay

## 2019-02-21 VITALS — BP 109/70 | HR 84 | Temp 97.9°F | Ht 65.0 in | Wt 137.6 lb

## 2019-02-21 DIAGNOSIS — K279 Peptic ulcer, site unspecified, unspecified as acute or chronic, without hemorrhage or perforation: Secondary | ICD-10-CM | POA: Diagnosis not present

## 2019-02-21 DIAGNOSIS — K2950 Unspecified chronic gastritis without bleeding: Secondary | ICD-10-CM | POA: Diagnosis not present

## 2019-02-21 MED ORDER — PANTOPRAZOLE SODIUM 40 MG PO TBEC: 40.0000 mg | DELAYED_RELEASE_TABLET | Freq: Every day | ORAL | 3 refills | Status: DC

## 2019-02-21 NOTE — Progress Notes (Signed)
CC'D TO PCP °

## 2019-02-21 NOTE — Progress Notes (Signed)
Referring Provider: Rosita Fire, MD Primary Care Physician:  Rosita Fire, MD Primary GI:  Dr. Oneida Alar  Chief Complaint  Patient presents with  . Gastroesophageal Reflux    f/u    HPI:   Emily Nichols is a 78 y.o. female who presents post procedure follow-up.  Patient was last seen in our office 06/22/2018 for GI bleed associated with gastric ulcer, peptic ulcer disease.  At that time she was a hospitalization follow-up and was due for upper endoscopy.  Colonoscopy up-to-date with no recommended future screening due to age.  Recent hospitalization in July 2019 for GI bleed related to erosive gastritis and gastric ulcer.  Placed on twice daily PPI therapy and Carafate.  Hemoglobin bottomed out at 8.2 during hospitalization and she was discharged 8.5.  During hospitalization EGD found nonbleeding gastric ulcer.  Her last visit she was doing well, finished twice daily PPI course and thinks she had an ulcer from taking too many arthritis medication doses.  No further symptoms.  Not on any further NSAIDs.  Recommended updated CBC, EGD, avoid NSAIDs, follow-up based on post procedure recommendations.  CBC completed 06/28/2018 showed normalization of hemoglobin at 12.1.  Recommended proceed with EGD based on previous recommendations.  EGD completed 08/13/2018 which found benign appearing esophageal stricture, small hiatal hernia, mild gastritis status post biopsy.  Surgical pathology found the biopsy to be chronic mild gastritis.  Recommended follow-up in 6 months.  Today she states she's doing well. GERD symptoms are doing well, no breakthrough. Was previously on Protonix but not anymore. Denies abdominal pain, N/V, hematochezia, melena, fever, chills, unintentional weight loss. Denies URI or flu-like symptoms. Denies loss of sense of taste or smell. Denies chest pain, dyspnea, dizziness, lightheadedness, syncope, near syncope. Denies any other upper or lower GI symptoms.  Past Medical  History:  Diagnosis Date  . Arthritis   . Colon polyps   . COPD (chronic obstructive pulmonary disease) ([place])   . Seasonal allergies     Past Surgical History:  Procedure Laterality Date  . BIOPSY  08/13/2018   Procedure: BIOPSY;  Surgeon: Danie Binder, MD;  [facility];  Service: Endoscopy;;  Gastric  . COLONOSCOPY N/A 04/07/2014   Procedure: COLONOSCOPY;  Surgeon: Danie Binder, MD;  [facility];  Service: Endoscopy;  Laterality: N/A;  8:30 AM  . COLONOSCOPY    . COLONOSCOPY N/A 09/18/2017   Procedure: COLONOSCOPY;  Surgeon: Danie Binder, MD;  [facility];  Service: Endoscopy;  Laterality: N/A;  2:00pm  . ESOPHAGOGASTRODUODENOSCOPY N/A 03/10/2018   Procedure: ESOPHAGOGASTRODUODENOSCOPY (EGD);  Surgeon: Rogene Houston, MD;  [facility];  Service: Endoscopy;  Laterality: N/A;  . ESOPHAGOGASTRODUODENOSCOPY N/A 08/13/2018   Procedure: ESOPHAGOGASTRODUODENOSCOPY (EGD);  Surgeon: Danie Binder, MD;  [facility];  Service: Endoscopy;  Laterality: N/A;  3:00pm    Current Outpatient Medications  Medication Sig Dispense Refill  . ADVAIR DISKUS 250-50 MCG/DOSE AEPB INHALE ONE PUFF BY MOUTH TWICE DAILY  3  . Calcium Carb-Cholecalciferol (CALCIUM 600+D3 PO) Take 1 tablet by mouth daily.    Marland Kitchen docusate sodium (COLACE) 100 MG capsule Take 100 mg by mouth 2 (two) times daily as needed for mild constipation.    . fluticasone (FLONASE) 50 MCG/ACT nasal spray Place 2 sprays into both nostrils daily as needed for allergies.     . hydrochlorothiazide (MICROZIDE) 12.5 MG capsule Take 12.5 mg by mouth daily as needed (foot swelling.).  3  . loratadine (CLARITIN) 10 MG tablet Take 10 mg by mouth daily as needed for allergies.     . montelukast (SINGULAIR) 10 MG tablet Take 10 mg by mouth daily.      No current facility-administered medications for this visit.     Allergies as of 02/21/2019  . (No Known Allergies)    Family History   Problem Relation Age of Onset  . Colon cancer Neg Hx   . Colon polyps Neg Hx     Social History   Socioeconomic History  . Marital status: Single    Spouse name: Not on file  . Number of children: Not on file  . Years of education: Not on file  . Highest education level: Not on file  Occupational History  . Not on file  Social Needs  . Financial resource strain: Not on file  . Food insecurity    Worry: Not on file    Inability: Not on file  . Transportation needs    Medical: Not on file    Non-medical: Not on file  Tobacco Use  . Smoking status: Current Some Day Smoker    Years: 10.00    Types: Cigarettes  . Smokeless tobacco: Never Used  . Tobacco comment: "Sometimes I just light up one and smoke it."  Substance and Sexual Activity  . Alcohol use: No  . Drug use: No  . Sexual activity: Not on file  Lifestyle  . Physical activity    Days per week: Not on file    Minutes per session: Not on file  . Stress: Not on file  Relationships  . Social Herbalist on phone: Not on file    Gets together: Not on file    Attends religious service: Not on file    Active member of club or organization: Not on file    Attends meetings of clubs or organizations: Not on file    Relationship status: Not on file  Other Topics Concern  . Not on file  Social History Narrative  . Not on file    Review of Systems: Complete ROS negative except as per HPI.   Physical Exam: BP 109/70   Pulse 84   Temp 97.9 F (36.6 C) (Oral)   Ht 5\' 5"  (1.651 m)   Wt 137 lb 9.6 oz (62.4 kg)   BMI 22.90 kg/m  General:   Alert and oriented. Pleasant and cooperative. Well-nourished and well-developed.  Eyes:  Without icterus, sclera clear and conjunctiva pink.  Ears:  Normal auditory acuity. Cardiovascular:  S1, S2 present without murmurs appreciated. Normal pulses noted. Extremities without clubbing or edema. Respiratory:  Clear to auscultation bilaterally. No wheezes, rales, or  rhonchi. No distress.  Gastrointestinal:  +BS, soft, non-tender and non-distended. No HSM noted. No guarding or rebound. No masses appreciated.  Rectal:  Deferred  Musculoskalatal:  Symmetrical without gross deformities. Neurologic:  Alert and oriented x4;  grossly normal neurologically. Psych:  Alert and cooperative. Normal mood and affect. Heme/Lymph/Immune: No excessive bruising noted.    02/21/2019 9:50 AM   Disclaimer: This note was dictated with voice recognition software. Similar sounding words can inadvertently be transcribed and may not be corrected upon review.

## 2019-02-21 NOTE — Patient Instructions (Addendum)
Your health issues we discussed today were:   Previous ulcer: 1. Your ulcer has completely healed vaccination point 2. This is great news 3. Continue to avoid NSAIDs (ibuprofen, Motrin, Advil, Aleve, naproxen, Naprosyn, Goody powders, BC powders). 4. If you have arthritis pain you can take Tylenol over-the-counter  Continued irritation of the lining of your stomach (gastritis): 1. Restart Protonix 40 mg.  Take this once a day, for sing in the morning on empty stomach 2. Call us if you have any severe or worsening symptoms  Overall I recommend:  1. Continue your other medications 2. Follow-up in 6 months 3. Call us if you have any questions or concerns.   Because of recent events of COVID-19 ("Coronavirus"), follow CDC recommendations:  1. Wash your hand frequently 2. Avoid touching your face 3. Stay away from people who are sick 4. If you have symptoms such as fever, cough, shortness of breath then call your healthcare provider for further guidance 5. If you are sick, STAY AT HOME unless otherwise directed by your healthcare provider. 6. Follow directions from state and national officials regarding staying safe   At [facility] we value your feedback. You may receive a survey about your visit today. Please share your experience as we strive to create trusting relationships with our patients to provide genuine, compassionate, quality care.  We appreciate your understanding and patience as we review any laboratory studies, imaging, and other diagnostic tests that are ordered as we care for you. Our office policy is 5 business days for review of these results, and any emergent or urgent results are addressed in a timely manner for your best interest. If you do not hear from our office in 1 week, please contact us.   We also encourage the use of MyChart, which contains your medical information for your review as well. If you are not enrolled in this feature, an access code  is on this after visit summary for your convenience. Thank you for allowing [place] to be involved in your care.  It was great to see you today!  I hope you have a great summer!!

## 2019-02-21 NOTE — Assessment & Plan Note (Signed)
Status post resolution of acute gastric ulcer resulting in anemia and acute GI bleed.  Her hemoglobin has normalized.  Her surveillance EGD found mild but chronically active gastritis.  She has since discontinued all PPIs.  She is no longer taking NSAIDs.  Recommend she continue to avoid NSAIDs.  No GERD symptoms.  I will start her back on Protonix 40 mg once a day for about the next 6 to 12 months due to active gastritis.  Follow-up in 6 months.

## 2019-02-21 NOTE — Assessment & Plan Note (Signed)
Previously hospitalized in July of last year for acute GI bleed related to gastric ulcer discovered upon EGD.  She was discharged on PPI twice daily.  At her last visit her hemoglobin had normalized.  She was due for surveillance EGD which found mild chronic active gastritis.  Today she states she is no longer on a PPI.  She has stopped taking NSAIDs.  Recommend she continue to avoid NSAIDs, restart Protonix 40 mg once a day for about the next 6 to 12 months.  Follow-up in 6 months.

## 2019-03-03 DIAGNOSIS — J449 Chronic obstructive pulmonary disease, unspecified: Secondary | ICD-10-CM | POA: Diagnosis not present

## 2019-03-03 DIAGNOSIS — M1990 Unspecified osteoarthritis, unspecified site: Secondary | ICD-10-CM | POA: Diagnosis not present

## 2019-04-03 DIAGNOSIS — M1990 Unspecified osteoarthritis, unspecified site: Secondary | ICD-10-CM | POA: Diagnosis not present

## 2019-04-03 DIAGNOSIS — D62 Acute posthemorrhagic anemia: Secondary | ICD-10-CM | POA: Diagnosis not present

## 2019-04-03 DIAGNOSIS — K922 Gastrointestinal hemorrhage, unspecified: Secondary | ICD-10-CM | POA: Diagnosis not present

## 2019-04-07 ENCOUNTER — Other Ambulatory Visit (HOSPITAL_COMMUNITY): Payer: Self-pay | Admitting: Internal Medicine

## 2019-04-07 DIAGNOSIS — Z1231 Encounter for screening mammogram for malignant neoplasm of breast: Secondary | ICD-10-CM

## 2019-04-13 DIAGNOSIS — I739 Peripheral vascular disease, unspecified: Secondary | ICD-10-CM | POA: Diagnosis not present

## 2019-04-13 DIAGNOSIS — M79671 Pain in right foot: Secondary | ICD-10-CM | POA: Diagnosis not present

## 2019-04-13 DIAGNOSIS — L110 Acquired keratosis follicularis: Secondary | ICD-10-CM | POA: Diagnosis not present

## 2019-04-13 DIAGNOSIS — M79672 Pain in left foot: Secondary | ICD-10-CM | POA: Diagnosis not present

## 2019-05-04 DIAGNOSIS — J449 Chronic obstructive pulmonary disease, unspecified: Secondary | ICD-10-CM | POA: Diagnosis not present

## 2019-05-04 DIAGNOSIS — M1990 Unspecified osteoarthritis, unspecified site: Secondary | ICD-10-CM | POA: Diagnosis not present

## 2019-05-09 ENCOUNTER — Inpatient Hospital Stay (HOSPITAL_COMMUNITY): Admission: RE | Admit: 2019-05-09 | Payer: Medicare Other | Source: Ambulatory Visit

## 2019-05-11 ENCOUNTER — Other Ambulatory Visit: Payer: Self-pay

## 2019-05-11 ENCOUNTER — Ambulatory Visit (HOSPITAL_COMMUNITY)
Admission: RE | Admit: 2019-05-11 | Discharge: 2019-05-11 | Disposition: A | Discharge status: Home / Self Care | Payer: Medicare Other | Source: Ambulatory Visit | Attending: Internal Medicine | Admitting: Internal Medicine

## 2019-05-11 DIAGNOSIS — Z1231 Encounter for screening mammogram for malignant neoplasm of breast: Secondary | ICD-10-CM | POA: Diagnosis not present

## 2019-05-23 ENCOUNTER — Telehealth: Payer: Self-pay | Admitting: Gastroenterology

## 2019-05-23 NOTE — Telephone Encounter (Signed)
LMOM for a return call.  

## 2019-05-23 NOTE — Telephone Encounter (Signed)
PLEASE CALL PATIENT, SHE HAS QUESTIONS ABOUT HER MEDICATIONS

## 2019-05-24 NOTE — Telephone Encounter (Signed)
LMOM to call.

## 2019-05-25 ENCOUNTER — Telehealth: Payer: Self-pay | Admitting: Gastroenterology

## 2019-05-25 NOTE — Telephone Encounter (Signed)
See previous note

## 2019-05-25 NOTE — Telephone Encounter (Signed)
Pt was returning call. [phone]

## 2019-05-25 NOTE — Telephone Encounter (Signed)
PT wanted to know if she is to  Continue Protonix and I told her yes, and she has refills on it at [facility]. She will call if she has questions.

## 2019-06-03 DIAGNOSIS — J4520 Mild intermittent asthma, uncomplicated: Secondary | ICD-10-CM | POA: Diagnosis not present

## 2019-06-03 DIAGNOSIS — M1990 Unspecified osteoarthritis, unspecified site: Secondary | ICD-10-CM | POA: Diagnosis not present

## 2019-06-29 DIAGNOSIS — M1990 Unspecified osteoarthritis, unspecified site: Secondary | ICD-10-CM | POA: Diagnosis not present

## 2019-06-29 DIAGNOSIS — K922 Gastrointestinal hemorrhage, unspecified: Secondary | ICD-10-CM | POA: Diagnosis not present

## 2019-06-29 DIAGNOSIS — J4520 Mild intermittent asthma, uncomplicated: Secondary | ICD-10-CM | POA: Diagnosis not present

## 2019-07-07 DIAGNOSIS — I739 Peripheral vascular disease, unspecified: Secondary | ICD-10-CM | POA: Diagnosis not present

## 2019-07-07 DIAGNOSIS — M79672 Pain in left foot: Secondary | ICD-10-CM | POA: Diagnosis not present

## 2019-07-07 DIAGNOSIS — M79671 Pain in right foot: Secondary | ICD-10-CM | POA: Diagnosis not present

## 2019-07-07 DIAGNOSIS — L110 Acquired keratosis follicularis: Secondary | ICD-10-CM | POA: Diagnosis not present

## 2019-07-29 DIAGNOSIS — M1990 Unspecified osteoarthritis, unspecified site: Secondary | ICD-10-CM | POA: Diagnosis not present

## 2019-07-29 DIAGNOSIS — J4520 Mild intermittent asthma, uncomplicated: Secondary | ICD-10-CM | POA: Diagnosis not present

## 2019-08-24 ENCOUNTER — Ambulatory Visit (INDEPENDENT_AMBULATORY_CARE_PROVIDER_SITE_OTHER): Payer: Medicare Other | Admitting: Nurse Practitioner

## 2019-08-24 ENCOUNTER — Other Ambulatory Visit: Payer: Self-pay

## 2019-08-24 ENCOUNTER — Encounter: Payer: Self-pay | Admitting: Nurse Practitioner

## 2019-08-24 VITALS — BP 111/68 | HR 75 | Temp 96.9°F | Ht 65.0 in | Wt 146.0 lb

## 2019-08-24 DIAGNOSIS — K279 Peptic ulcer, site unspecified, unspecified as acute or chronic, without hemorrhage or perforation: Secondary | ICD-10-CM | POA: Diagnosis not present

## 2019-08-24 DIAGNOSIS — K2950 Unspecified chronic gastritis without bleeding: Secondary | ICD-10-CM

## 2019-08-24 DIAGNOSIS — K254 Chronic or unspecified gastric ulcer with hemorrhage: Secondary | ICD-10-CM | POA: Diagnosis not present

## 2019-08-24 NOTE — Patient Instructions (Signed)
Your health issues we discussed today were:   Previous ulcer due to Naproxen: 1. As we discussed you developed an ulcer due to naproxen/Advil type medications 2. Continue avoiding all NSAIDs (ibuprofen, Motrin, Advil, Aleve, naproxen, Naprosyn, Goody powder, BC powder, aspirin, anything with "NSAID" on the bottle). 3. If you have arthritis pain you can take Tylenol which will not irritate the lining of your stomach 4. Per your request, we will hold off on any more Protonix medication (the medicine you finished today) 5. Call us if you develop any abdominal pain, heartburn/reflux symptoms  Overall I recommend:  1. Continue your other current medications 2. Return for follow-up in 6 months 3. Call us if you have any questions or concerns.   Because of recent events of COVID-19 ("Coronavirus"), follow CDC recommendations:  Wash your hand frequently Avoid touching your face Stay away from people who are sick If you have symptoms such as fever, cough, shortness of breath then call your healthcare provider for further guidance If you are sick, STAY AT HOME unless otherwise directed by your healthcare provider. Follow directions from state and national officials regarding staying safe   At [facility] we value your feedback. You may receive a survey about your visit today. Please share your experience as we strive to create trusting relationships with our patients to provide genuine, compassionate, quality care.  We appreciate your understanding and patience as we review any laboratory studies, imaging, and other diagnostic tests that are ordered as we care for you. Our office policy is 5 business days for review of these results, and any emergent or urgent results are addressed in a timely manner for your best interest. If you do not hear from our office in 1 week, please contact us.   We also encourage the use of MyChart, which contains your medical information for your review  as well. If you are not enrolled in this feature, an access code is on this after visit summary for your convenience. Thank you for allowing [place] to be involved in your care.  It was great to see you today!  I hope you have a Happy New Year!!

## 2019-08-24 NOTE — Assessment & Plan Note (Signed)
History of peptic ulcer disease on EGD which was documented is healed in December 2019 with the assistance of Protonix twice daily.  She was kept on Protonix once daily until today.  She does utilize her medication and is wanting to come off.  We will trial her for 6 months off Protonix.  She is to call us with any worsening symptoms.  Otherwise follow-up in 6 months.

## 2019-08-24 NOTE — Progress Notes (Signed)
Cc'ed to pcp °

## 2019-08-24 NOTE — Progress Notes (Signed)
Referring Provider: Rosita Fire, MD Primary Care Physician:  Rosita Fire, MD Primary GI:  Dr. Oneida Alar  Chief Complaint  Patient presents with  . Peptic Ulcer Disease    doing ok    HPI:   Emily Nichols is a 79 y.o. female who presents for follow-up and peptic ulcer disease and gastritis.  Patient was last seen in our office 02/21/2019 for the same.  Previous GI bleed associated with gastric ulcer.  Colonoscopy up-to-date with no recommendations for future screening due to age.  She was hospitalized in July 2019 for GI bleed resulting from her ulcer.  Hemoglobin low at 8.2 during hospitalization.  She completed a course twice daily PPI and overall felt her ulcer was because of too many arthritis medication doses.  Hemoglobin normalized in November 2019 at 12.1.  Recommended repeat EGD previously.  Her EGD was updated 08/13/2018 was found benign appearing esophageal stricture, small hiatal hernia, mild gastritis status post biopsy found to be mild chronic gastritis.  Recommended follow-up in 6 months.  At her last visit GERD doing well without breakthrough, no longer requiring Protonix.  No other overt GI complaints.  Recommended strict NSAID avoidance, use Tylenol only for over-the-counter pain management, restart Protonix 40 mg daily, follow-up in 6 months.  Today she states she's doing well overall. Took her last dose of Protonix today. Denies any further NSAIDs. Denies abdominal pain, N/V, hematochezia, melena, fever, chills, unintentional weight loss. Denies URI or flu-like symptoms. Denies loss of sense of taste or smell. Denies chest pain, dyspnea, dizziness, lightheadedness, syncope, near syncope. Denies any other upper or lower GI symptoms.  She is hoping to be able to come off her Protonix.  Past Medical History:  Diagnosis Date  . Arthritis   . Colon polyps   . COPD (chronic obstructive pulmonary disease) ([place])   . Seasonal allergies     Past Surgical History:    Procedure Laterality Date  . BIOPSY  08/13/2018   Procedure: BIOPSY;  Surgeon: Danie Binder, MD;  [facility];  Service: Endoscopy;;  Gastric  . COLONOSCOPY N/A 04/07/2014   Procedure: COLONOSCOPY;  Surgeon: Danie Binder, MD;  [facility];  Service: Endoscopy;  Laterality: N/A;  8:30 AM  . COLONOSCOPY    . COLONOSCOPY N/A 09/18/2017   Procedure: COLONOSCOPY;  Surgeon: Danie Binder, MD;  [facility];  Service: Endoscopy;  Laterality: N/A;  2:00pm  . ESOPHAGOGASTRODUODENOSCOPY N/A 03/10/2018   Procedure: ESOPHAGOGASTRODUODENOSCOPY (EGD);  Surgeon: Rogene Houston, MD;  [facility];  Service: Endoscopy;  Laterality: N/A;  . ESOPHAGOGASTRODUODENOSCOPY N/A 08/13/2018   Procedure: ESOPHAGOGASTRODUODENOSCOPY (EGD);  Surgeon: Danie Binder, MD;  [facility];  Service: Endoscopy;  Laterality: N/A;  3:00pm    Current Outpatient Medications  Medication Sig Dispense Refill  . ADVAIR DISKUS 250-50 MCG/DOSE AEPB INHALE ONE PUFF BY MOUTH TWICE DAILY  3  . Calcium Carb-Cholecalciferol (CALCIUM 600+D3 PO) Take 1 tablet by mouth daily.    Marland Kitchen docusate sodium (COLACE) 100 MG capsule Take 100 mg by mouth 2 (two) times daily as needed for mild constipation.    . fluticasone (FLONASE) 50 MCG/ACT nasal spray Place 2 sprays into both nostrils daily as needed for allergies.     . hydrochlorothiazide (MICROZIDE) 12.5 MG capsule Take 12.5 mg by mouth daily as needed (foot swelling.).   3  . loratadine (CLARITIN) 10 MG tablet Take 10 mg by mouth daily as needed  for allergies.     . montelukast (SINGULAIR) 10 MG tablet Take 10 mg by mouth daily.     . pantoprazole (PROTONIX) 40 MG tablet Take 1 tablet (40 mg total) by mouth daily. 90 tablet 3   No current facility-administered medications for this visit.    Allergies as of 08/24/2019  . (No Known Allergies)    Family History  Problem Relation Age of Onset  . Colon cancer Neg Hx   . Colon polyps  Neg Hx     Social History   Socioeconomic History  . Marital status: Single    Spouse name: Not on file  . Number of children: Not on file  . Years of education: Not on file  . Highest education level: Not on file  Occupational History  . Not on file  Tobacco Use  . Smoking status: Current Some Day Smoker    Years: 10.00    Types: Cigarettes  . Smokeless tobacco: Never Used  . Tobacco comment: "Sometimes I just light up one and smoke it."  Substance and Sexual Activity  . Alcohol use: No  . Drug use: No  . Sexual activity: Not on file  Other Topics Concern  . Not on file  Social History Narrative  . Not on file   Social Determinants of Health   Financial Resource Strain:   . Difficulty of Paying Living Expenses: Not on file  Food Insecurity:   . Worried About Charity fundraiser in the Last Year: Not on file  . Ran Out of Food in the Last Year: Not on file  Transportation Needs:   . Lack of Transportation (Medical): Not on file  . Lack of Transportation (Non-Medical): Not on file  Physical Activity:   . Days of Exercise per Week: Not on file  . Minutes of Exercise per Session: Not on file  Stress:   . Feeling of Stress : Not on file  Social Connections:   . Frequency of Communication with Friends and Family: Not on file  . Frequency of Social Gatherings with Friends and Family: Not on file  . Attends Religious Services: Not on file  . Active Member of Clubs or Organizations: Not on file  . Attends Archivist Meetings: Not on file  . Marital Status: Not on file    Review of Systems: General: Negative for anorexia, weight loss, fever, chills, fatigue, weakness. ENT: Negative for hoarseness, difficulty swallowing. CV: Negative for chest pain, angina, palpitations, peripheral edema.  Respiratory: Negative for dyspnea at rest, cough, sputum, wheezing.  GI: See history of present illness. Endo: Negative for unusual weight change.  Heme: Negative for  bruising or bleeding. Allergy: Negative for rash or hives.   Physical Exam: BP 111/68   Pulse 75   Temp (!) 96.9 F (36.1 C) (Temporal)   Ht 5\' 5"  (1.651 m)   Wt 146 lb (66.2 kg)   BMI 24.30 kg/m  General:   Alert and oriented. Pleasant and cooperative. Well-nourished and well-developed.  Eyes:  Without icterus, sclera clear and conjunctiva pink.  Ears:  Normal auditory acuity. Cardiovascular:  S1, S2 present without murmurs appreciated. Extremities without clubbing or edema. Respiratory:  Clear to auscultation bilaterally. No wheezes, rales, or rhonchi. No distress.  Gastrointestinal:  +BS, soft, non-tender and non-distended. No HSM noted. No guarding or rebound. No masses appreciated.  Rectal:  Deferred  Musculoskalatal:  Symmetrical without gross deformities. Neurologic:  Alert and oriented x4;  grossly normal neurologically. Psych:  Alert and cooperative. Normal mood and affect. Heme/Lymph/Immune: No excessive bruising noted.    08/24/2019 1:53 PM   Disclaimer: This note was dictated with voice recognition software. Similar sounding words can inadvertently be transcribed and may not be corrected upon review.

## 2019-08-24 NOTE — Assessment & Plan Note (Signed)
No further GI bleed per the patient.  No melena either.  Continue to monitor, can always restart Protonix as needed.

## 2019-08-24 NOTE — Assessment & Plan Note (Signed)
Currently asymptomatic with Protonix.  At her previous office visit she was also asymptomatic even though she was off Protonix.  She was restarted with Protonix at that time.  At this time she is not having any symptoms and she requested to come off her medication.  I will trial her off Protonix with the understanding of avoiding all NSAIDs.  She says she will not take anymore NSAIDs because of the problems and call us previously.  I told her she does have arthritis pain she can take Tylenol.  Return for follow-up in 6 months.  Call for any worsening or severe symptoms.

## 2019-08-29 DIAGNOSIS — J329 Chronic sinusitis, unspecified: Secondary | ICD-10-CM | POA: Diagnosis not present

## 2019-08-29 DIAGNOSIS — M1990 Unspecified osteoarthritis, unspecified site: Secondary | ICD-10-CM | POA: Diagnosis not present

## 2019-09-22 DIAGNOSIS — M79671 Pain in right foot: Secondary | ICD-10-CM | POA: Diagnosis not present

## 2019-09-22 DIAGNOSIS — M79672 Pain in left foot: Secondary | ICD-10-CM | POA: Diagnosis not present

## 2019-09-22 DIAGNOSIS — L110 Acquired keratosis follicularis: Secondary | ICD-10-CM | POA: Diagnosis not present

## 2019-09-22 DIAGNOSIS — I739 Peripheral vascular disease, unspecified: Secondary | ICD-10-CM | POA: Diagnosis not present

## 2019-10-10 DIAGNOSIS — Z0001 Encounter for general adult medical examination with abnormal findings: Secondary | ICD-10-CM | POA: Diagnosis not present

## 2019-10-10 DIAGNOSIS — D62 Acute posthemorrhagic anemia: Secondary | ICD-10-CM | POA: Diagnosis not present

## 2019-10-10 DIAGNOSIS — F17210 Nicotine dependence, cigarettes, uncomplicated: Secondary | ICD-10-CM | POA: Diagnosis not present

## 2019-10-10 DIAGNOSIS — Z1389 Encounter for screening for other disorder: Secondary | ICD-10-CM | POA: Diagnosis not present

## 2019-10-10 DIAGNOSIS — J4520 Mild intermittent asthma, uncomplicated: Secondary | ICD-10-CM | POA: Diagnosis not present

## 2019-10-10 DIAGNOSIS — K922 Gastrointestinal hemorrhage, unspecified: Secondary | ICD-10-CM | POA: Diagnosis not present

## 2019-10-18 DIAGNOSIS — D62 Acute posthemorrhagic anemia: Secondary | ICD-10-CM | POA: Diagnosis not present

## 2019-10-18 DIAGNOSIS — J4520 Mild intermittent asthma, uncomplicated: Secondary | ICD-10-CM | POA: Diagnosis not present

## 2019-10-18 DIAGNOSIS — E785 Hyperlipidemia, unspecified: Secondary | ICD-10-CM | POA: Diagnosis not present

## 2019-10-18 DIAGNOSIS — Z0001 Encounter for general adult medical examination with abnormal findings: Secondary | ICD-10-CM | POA: Diagnosis not present

## 2019-10-29 ENCOUNTER — Ambulatory Visit: Payer: Medicare Other | Attending: Internal Medicine

## 2019-10-29 ENCOUNTER — Other Ambulatory Visit: Payer: Self-pay

## 2019-10-29 DIAGNOSIS — Z23 Encounter for immunization: Secondary | ICD-10-CM

## 2019-10-29 NOTE — Progress Notes (Signed)
   Covid-19 Vaccination Clinic  Name:  Emily Nichols    MRN: [id number] DOB: 1941-01-30  10/29/2019  Emily Nichols was observed post Covid-19 immunization for 15 minutes without incident. She was provided with Vaccine Information Sheet and instruction to access the V-Safe system.   Emily Nichols was instructed to call 911 with any severe reactions post vaccine: Marland Kitchen Difficulty breathing  . Swelling of face and throat  . A fast heartbeat  . A bad rash all over body  . Dizziness and weakness   Immunizations Administered    Name Date Dose VIS Date Route   Moderna COVID-19 Vaccine 10/29/2019 12:56 PM 0.5 mL 07/19/2019 Intramuscular   Manufacturer: Moderna   Lot: [id number]   [id number]

## 2019-11-07 DIAGNOSIS — J4520 Mild intermittent asthma, uncomplicated: Secondary | ICD-10-CM | POA: Diagnosis not present

## 2019-11-07 DIAGNOSIS — J329 Chronic sinusitis, unspecified: Secondary | ICD-10-CM | POA: Diagnosis not present

## 2019-11-07 DIAGNOSIS — K922 Gastrointestinal hemorrhage, unspecified: Secondary | ICD-10-CM | POA: Diagnosis not present

## 2019-11-30 ENCOUNTER — Ambulatory Visit: Payer: Medicare Other | Attending: Internal Medicine

## 2019-11-30 DIAGNOSIS — Z23 Encounter for immunization: Secondary | ICD-10-CM

## 2019-11-30 NOTE — Progress Notes (Signed)
   Covid-19 Vaccination Clinic  Name:  Emily Nichols    MRN: [id number] DOB: 1941-04-30  11/30/2019  [name] was observed post Covid-19 immunization for 15 minutes without incident. She was provided with Vaccine Information Sheet and instruction to access the V-Safe system.   [name] was instructed to call 911 with any severe reactions post vaccine: Marland Kitchen Difficulty breathing  . Swelling of face and throat  . A fast heartbeat  . A bad rash all over body  . Dizziness and weakness   Immunizations Administered    Name Date Dose VIS Date Route   Moderna COVID-19 Vaccine 11/30/2019 12:13 PM 0.5 mL 07/19/2019 Intramuscular   Manufacturer: Moderna   Lot: [id number]   [id number]

## 2019-12-08 DIAGNOSIS — K922 Gastrointestinal hemorrhage, unspecified: Secondary | ICD-10-CM | POA: Diagnosis not present

## 2019-12-08 DIAGNOSIS — M1990 Unspecified osteoarthritis, unspecified site: Secondary | ICD-10-CM | POA: Diagnosis not present

## 2019-12-22 DIAGNOSIS — I739 Peripheral vascular disease, unspecified: Secondary | ICD-10-CM | POA: Diagnosis not present

## 2019-12-22 DIAGNOSIS — M79672 Pain in left foot: Secondary | ICD-10-CM | POA: Diagnosis not present

## 2019-12-22 DIAGNOSIS — M79671 Pain in right foot: Secondary | ICD-10-CM | POA: Diagnosis not present

## 2019-12-22 DIAGNOSIS — L110 Acquired keratosis follicularis: Secondary | ICD-10-CM | POA: Diagnosis not present

## 2019-12-26 DIAGNOSIS — J4520 Mild intermittent asthma, uncomplicated: Secondary | ICD-10-CM | POA: Diagnosis not present

## 2019-12-26 DIAGNOSIS — R1011 Right upper quadrant pain: Secondary | ICD-10-CM | POA: Diagnosis not present

## 2019-12-27 ENCOUNTER — Other Ambulatory Visit (HOSPITAL_COMMUNITY): Payer: Self-pay | Admitting: Internal Medicine

## 2019-12-27 ENCOUNTER — Other Ambulatory Visit: Payer: Self-pay | Admitting: Internal Medicine

## 2019-12-27 DIAGNOSIS — R1011 Right upper quadrant pain: Secondary | ICD-10-CM

## 2020-01-04 ENCOUNTER — Ambulatory Visit (HOSPITAL_COMMUNITY)
Admission: RE | Admit: 2020-01-04 | Discharge: 2020-01-04 | Disposition: A | Discharge status: Home / Self Care | Payer: Medicare Other | Source: Ambulatory Visit | Attending: Internal Medicine | Admitting: Internal Medicine

## 2020-01-04 ENCOUNTER — Other Ambulatory Visit: Payer: Self-pay

## 2020-01-04 DIAGNOSIS — R1011 Right upper quadrant pain: Secondary | ICD-10-CM | POA: Diagnosis not present

## 2020-01-12 DIAGNOSIS — M1990 Unspecified osteoarthritis, unspecified site: Secondary | ICD-10-CM | POA: Diagnosis not present

## 2020-01-12 DIAGNOSIS — R0789 Other chest pain: Secondary | ICD-10-CM | POA: Diagnosis not present

## 2020-01-12 DIAGNOSIS — J309 Allergic rhinitis, unspecified: Secondary | ICD-10-CM | POA: Diagnosis not present

## 2020-02-12 DIAGNOSIS — M1990 Unspecified osteoarthritis, unspecified site: Secondary | ICD-10-CM | POA: Diagnosis not present

## 2020-02-12 DIAGNOSIS — J4520 Mild intermittent asthma, uncomplicated: Secondary | ICD-10-CM | POA: Diagnosis not present

## 2020-02-21 ENCOUNTER — Encounter: Payer: Self-pay | Admitting: Nurse Practitioner

## 2020-02-21 ENCOUNTER — Other Ambulatory Visit: Payer: Self-pay

## 2020-02-21 ENCOUNTER — Ambulatory Visit (INDEPENDENT_AMBULATORY_CARE_PROVIDER_SITE_OTHER): Payer: Medicare Other | Admitting: Nurse Practitioner

## 2020-02-21 VITALS — BP 130/77 | HR 81 | Temp 97.1°F | Ht 65.0 in | Wt 147.2 lb

## 2020-02-21 DIAGNOSIS — K279 Peptic ulcer, site unspecified, unspecified as acute or chronic, without hemorrhage or perforation: Secondary | ICD-10-CM

## 2020-02-21 DIAGNOSIS — K254 Chronic or unspecified gastric ulcer with hemorrhage: Secondary | ICD-10-CM | POA: Diagnosis not present

## 2020-02-21 DIAGNOSIS — K2950 Unspecified chronic gastritis without bleeding: Secondary | ICD-10-CM

## 2020-02-21 NOTE — Assessment & Plan Note (Signed)
Generally asymptomatic on Protonix currently.  She was asymptomatic off of Protonix as well.  Reviewed her imaging history and last bone density scan was in 2017.  Recommendations for bone density scanning every 2 years given her abnormal exam.  Increased fracture risk of 7.9%.  No definitive causation between fracture risk and PPI use, but theoretical malabsorption of calcium and other minerals could increase her risk.  Recommended she address bone density screening with primary care at her follow-up visit in 1 month.

## 2020-02-21 NOTE — Progress Notes (Addendum)
Referring Provider: Rosita Fire, MD Primary Care Physician:  Rosita Fire, MD Primary GI:  Dr. Gala Romney (in the absence of Dr. Oneida Alar); pending Dr. Abbey Chatters  Chief Complaint  Patient presents with  . Peptic Ulcer Disease    f/u. Doing okay  . GI Bleeding    f/u.    HPI:   Emily Nichols is a 79 y.o. female who presents for follow-up on GIB and gastritis. The patient was last seen in our office 08/24/2019.  Noted previous GI bleed associated with gastric ulcer.  Colonoscopy up-to-date with no recommendations for future screening due to age.  Previous hospitalization in July 2019 for GI bleed resulting from peptic ulcer disease.  Hemoglobin normalized in November 2019 at 12.1.  EGD updated 08/13/2018 with a benign appearing esophageal stricture, small hiatal hernia, mild gastritis status post biopsy found to be mild chronic gastritis.  Recommended follow-up in the office in 6 months.  At her last visit she was doing well, no further NSAID use, no other overt GI complaints.  She took her last dose of Protonix a day.  She is hoping to come off Protonix eventually.  Recommended continue strict avoidance of NSAIDs, use Tylenol for arthritis pain, hold off on Protonix for now and notify us of any recurrent symptoms.  Follow-up in 6 months.  No further communication from the patient.  Today she states she's doing ok overall. Was put back on PPI by primary care who put her back on her PPI. States she wasn't having any more stomach issues. She is taking it now. Denies abdominal pain, N/V, hematochezia, melena, fever, chills, unintentional weight loss. Denies URI or flu-like symptoms. Denies loss of sense of taste or smell. The patient has received COVID-19 vaccination(s). Denies chest pain, dyspnea, dizziness, lightheadedness, syncope, near syncope. Denies any other upper or lower GI symptoms.  Past Medical History:  Diagnosis Date  . Arthritis   . Colon polyps   . COPD (chronic obstructive  pulmonary disease) ([place])   . Seasonal allergies     Past Surgical History:  Procedure Laterality Date  . BIOPSY  08/13/2018   Procedure: BIOPSY;  Surgeon: Danie Binder, MD;  [facility];  Service: Endoscopy;;  Gastric  . COLONOSCOPY N/A 04/07/2014   Procedure: COLONOSCOPY;  Surgeon: Danie Binder, MD;  [facility];  Service: Endoscopy;  Laterality: N/A;  8:30 AM  . COLONOSCOPY    . COLONOSCOPY N/A 09/18/2017   Procedure: COLONOSCOPY;  Surgeon: Danie Binder, MD;  [facility];  Service: Endoscopy;  Laterality: N/A;  2:00pm  . ESOPHAGOGASTRODUODENOSCOPY N/A 03/10/2018   Procedure: ESOPHAGOGASTRODUODENOSCOPY (EGD);  Surgeon: Rogene Houston, MD;  [facility];  Service: Endoscopy;  Laterality: N/A;  . ESOPHAGOGASTRODUODENOSCOPY N/A 08/13/2018   Procedure: ESOPHAGOGASTRODUODENOSCOPY (EGD);  Surgeon: Danie Binder, MD;  [facility];  Service: Endoscopy;  Laterality: N/A;  3:00pm    Current Outpatient Medications  Medication Sig Dispense Refill  . ADVAIR DISKUS 250-50 MCG/DOSE AEPB INHALE ONE PUFF BY MOUTH TWICE DAILY  3  . Calcium Carb-Cholecalciferol (CALCIUM 600+D3 PO) Take 1 tablet by mouth daily.    Marland Kitchen docusate sodium (COLACE) 100 MG capsule Take 100 mg by mouth 2 (two) times daily as needed for mild constipation.    . fluticasone (FLONASE) 50 MCG/ACT nasal spray Place 2 sprays into both nostrils daily as needed for allergies.     . hydrochlorothiazide (MICROZIDE) 12.5 MG capsule Take 12.5 mg  by mouth daily as needed (foot swelling.).   3  . loratadine (CLARITIN) 10 MG tablet Take 10 mg by mouth daily as needed for allergies.     . montelukast (SINGULAIR) 10 MG tablet Take 10 mg by mouth daily.     . pantoprazole (PROTONIX) 40 MG tablet Take 1 tablet (40 mg total) by mouth daily. 90 tablet 3   No current facility-administered medications for this visit.    Allergies as of 02/21/2020  . (No Known Allergies)    Family  History  Problem Relation Age of Onset  . Colon cancer Neg Hx   . Colon polyps Neg Hx     Social History   Socioeconomic History  . Marital status: Single    Spouse name: Not on file  . Number of children: Not on file  . Years of education: Not on file  . Highest education level: Not on file  Occupational History  . Not on file  Tobacco Use  . Smoking status: Current Some Day Smoker    Years: 10.00    Types: Cigarettes  . Smokeless tobacco: Never Used  . Tobacco comment: "Sometimes I just light up one and smoke it."  Vaping Use  . Vaping Use: Never used  Substance and Sexual Activity  . Alcohol use: No  . Drug use: No  . Sexual activity: Not on file  Other Topics Concern  . Not on file  Social History Narrative  . Not on file   Social Determinants of Health   Financial Resource Strain:   . Difficulty of Paying Living Expenses:   Food Insecurity:   . Worried About Charity fundraiser in the Last Year:   . Arboriculturist in the Last Year:   Transportation Needs:   . Film/video editor (Medical):   Marland Kitchen Lack of Transportation (Non-Medical):   Physical Activity:   . Days of Exercise per Week:   . Minutes of Exercise per Session:   Stress:   . Feeling of Stress :   Social Connections:   . Frequency of Communication with Friends and Family:   . Frequency of Social Gatherings with Friends and Family:   . Attends Religious Services:   . Active Member of Clubs or Organizations:   . Attends Archivist Meetings:   Marland Kitchen Marital Status:     Subjective: Review of Systems  Constitutional: Negative for chills, fever, malaise/fatigue and weight loss.  HENT: Negative for congestion and sore throat.   Respiratory: Negative for cough and shortness of breath.   Cardiovascular: Negative for chest pain and palpitations.  Gastrointestinal: Negative for abdominal pain, blood in stool, diarrhea, melena, nausea and vomiting.  Musculoskeletal: Negative for joint pain and  myalgias.  Skin: Negative for rash.  Neurological: Negative for dizziness and weakness.  Endo/Heme/Allergies: Does not bruise/bleed easily.  Psychiatric/Behavioral: Negative for depression. The patient is not nervous/anxious.   All other systems reviewed and are negative.    Objective: BP 130/77   Pulse 81   Temp (!) 97.1 F (36.2 C) (Oral)   Ht 5\' 5"  (1.651 m)   Wt 147 lb 3.2 oz (66.8 kg)   BMI 24.50 kg/m  Physical Exam Vitals and nursing note reviewed.  Constitutional:      General: She is not in acute distress.    Appearance: Normal appearance. She is well-developed and normal weight. She is not ill-appearing, toxic-appearing or diaphoretic.  HENT:     Head: Normocephalic and atraumatic.  Nose: No congestion or rhinorrhea.  Eyes:     General: No scleral icterus. Cardiovascular:     Rate and Rhythm: Normal rate and regular rhythm.     Heart sounds: Normal heart sounds.  Pulmonary:     Effort: Pulmonary effort is normal. No respiratory distress.     Breath sounds: Normal breath sounds.  Abdominal:     General: Bowel sounds are normal.     Palpations: Abdomen is soft. There is no hepatomegaly, splenomegaly or mass.     Tenderness: There is no abdominal tenderness. There is no guarding or rebound.     Hernia: No hernia is present.  Skin:    General: Skin is warm and dry.     Coloration: Skin is not jaundiced.     Findings: No rash.  Neurological:     General: No focal deficit present.     Mental Status: She is alert and oriented to person, place, and time.  Psychiatric:        Attention and Perception: Attention normal.        Mood and Affect: Mood normal.        Speech: Speech normal.        Behavior: Behavior normal.        Thought Content: Thought content normal.        Cognition and Memory: Cognition and memory normal.       02/21/2020 1:58 PM   Disclaimer: This note was dictated with voice recognition software. Similar sounding words can inadvertently  be transcribed and may not be corrected upon review.

## 2020-02-21 NOTE — Assessment & Plan Note (Signed)
Previous GI bleed in 2019 resulting from peptic ulcer disease.  Status post treatment with PPI and updated EGD with mild gastritis but no further ulceration.  She has been avoiding NSAIDs diligently as recommended.  We did stop her Protonix at her last visit per her request, however her primary care has restarted this.  She states that she will take the bottle that she has but she would like to stop taking the medication.  She did not have any GERD or upper GI symptoms off of Protonix.  I feel relatively confident that she should not have further peptic ulcer disease or GI bleeding issues if she remains diligent about abstaining from NSAIDs.  Further recommendations to follow.  Follow-up in 1 year.

## 2020-02-21 NOTE — Assessment & Plan Note (Signed)
History of peptic ulcer disease due to previous NSAID use.  Resulting GI bleed in 2019.  She has not been on NSAIDs since then, has been on a PPI.  Most recent EGD in December 2019 with mild gastritis but no further PUD.  Recommend she continue her current medications, follow-up in 1 year.

## 2020-02-21 NOTE — Patient Instructions (Signed)
Your health issues we discussed today were:   Previous history of ulcers, GERD (reflux/heartburn), previous GI bleed: 1. Continue taking your Protonix for now 2. As we discussed, you should talk to your primary care provider at your next visit in 1 month about having a bone density scan completed 3. Call us for any worsening or recurrent heartburn/reflux-like symptoms or upper abdominal pain symptoms  Overall I recommend:  1. Continue your other current medications 2. Return for follow-up in 1 year 3. Call us if you have any questions or concerns.   ---------------------------------------------------------------  I am glad you have gotten your COVID-19 vaccination!  Even though you are fully vaccinated you should continue to follow CDC and state/local guidelines.  ---------------------------------------------------------------   At [facility] we value your feedback. You may receive a survey about your visit today. Please share your experience as we strive to create trusting relationships with our patients to provide genuine, compassionate, quality care.  We appreciate your understanding and patience as we review any laboratory studies, imaging, and other diagnostic tests that are ordered as we care for you. Our office policy is 5 business days for review of these results, and any emergent or urgent results are addressed in a timely manner for your best interest. If you do not hear from our office in 1 week, please contact us.   We also encourage the use of MyChart, which contains your medical information for your review as well. If you are not enrolled in this feature, an access code is on this after visit summary for your convenience. Thank you for allowing [place] to be involved in your care.  It was great to see you today!  I hope you have a great Summer!!

## 2020-03-08 DIAGNOSIS — L110 Acquired keratosis follicularis: Secondary | ICD-10-CM | POA: Diagnosis not present

## 2020-03-08 DIAGNOSIS — M79671 Pain in right foot: Secondary | ICD-10-CM | POA: Diagnosis not present

## 2020-03-08 DIAGNOSIS — I739 Peripheral vascular disease, unspecified: Secondary | ICD-10-CM | POA: Diagnosis not present

## 2020-03-08 DIAGNOSIS — M79672 Pain in left foot: Secondary | ICD-10-CM | POA: Diagnosis not present

## 2020-03-13 DIAGNOSIS — J4520 Mild intermittent asthma, uncomplicated: Secondary | ICD-10-CM | POA: Diagnosis not present

## 2020-03-13 DIAGNOSIS — M1990 Unspecified osteoarthritis, unspecified site: Secondary | ICD-10-CM | POA: Diagnosis not present

## 2020-03-26 DIAGNOSIS — M1990 Unspecified osteoarthritis, unspecified site: Secondary | ICD-10-CM | POA: Diagnosis not present

## 2020-03-26 DIAGNOSIS — J4520 Mild intermittent asthma, uncomplicated: Secondary | ICD-10-CM | POA: Diagnosis not present

## 2020-03-26 DIAGNOSIS — K274 Chronic or unspecified peptic ulcer, site unspecified, with hemorrhage: Secondary | ICD-10-CM | POA: Diagnosis not present

## 2020-03-26 DIAGNOSIS — J309 Allergic rhinitis, unspecified: Secondary | ICD-10-CM | POA: Diagnosis not present

## 2020-04-25 ENCOUNTER — Other Ambulatory Visit (HOSPITAL_COMMUNITY): Payer: Self-pay | Admitting: Internal Medicine

## 2020-04-25 DIAGNOSIS — Z1231 Encounter for screening mammogram for malignant neoplasm of breast: Secondary | ICD-10-CM

## 2020-04-26 DIAGNOSIS — M1990 Unspecified osteoarthritis, unspecified site: Secondary | ICD-10-CM | POA: Diagnosis not present

## 2020-04-26 DIAGNOSIS — J4520 Mild intermittent asthma, uncomplicated: Secondary | ICD-10-CM | POA: Diagnosis not present

## 2020-05-03 DIAGNOSIS — L110 Acquired keratosis follicularis: Secondary | ICD-10-CM | POA: Diagnosis not present

## 2020-05-03 DIAGNOSIS — I739 Peripheral vascular disease, unspecified: Secondary | ICD-10-CM | POA: Diagnosis not present

## 2020-05-03 DIAGNOSIS — M79671 Pain in right foot: Secondary | ICD-10-CM | POA: Diagnosis not present

## 2020-05-03 DIAGNOSIS — M79672 Pain in left foot: Secondary | ICD-10-CM | POA: Diagnosis not present

## 2020-05-14 ENCOUNTER — Other Ambulatory Visit: Payer: Self-pay

## 2020-05-14 ENCOUNTER — Ambulatory Visit (HOSPITAL_COMMUNITY)
Admission: RE | Admit: 2020-05-14 | Discharge: 2020-05-14 | Disposition: A | Discharge status: Home / Self Care | Payer: Medicare Other | Source: Ambulatory Visit | Attending: Internal Medicine | Admitting: Internal Medicine

## 2020-05-14 DIAGNOSIS — Z1231 Encounter for screening mammogram for malignant neoplasm of breast: Secondary | ICD-10-CM | POA: Diagnosis not present

## 2020-05-26 DIAGNOSIS — J4520 Mild intermittent asthma, uncomplicated: Secondary | ICD-10-CM | POA: Diagnosis not present

## 2020-05-26 DIAGNOSIS — M1990 Unspecified osteoarthritis, unspecified site: Secondary | ICD-10-CM | POA: Diagnosis not present

## 2020-06-26 DIAGNOSIS — M1990 Unspecified osteoarthritis, unspecified site: Secondary | ICD-10-CM | POA: Diagnosis not present

## 2020-06-26 DIAGNOSIS — J4520 Mild intermittent asthma, uncomplicated: Secondary | ICD-10-CM | POA: Diagnosis not present

## 2020-07-19 DIAGNOSIS — M79671 Pain in right foot: Secondary | ICD-10-CM | POA: Diagnosis not present

## 2020-07-19 DIAGNOSIS — L110 Acquired keratosis follicularis: Secondary | ICD-10-CM | POA: Diagnosis not present

## 2020-07-19 DIAGNOSIS — I739 Peripheral vascular disease, unspecified: Secondary | ICD-10-CM | POA: Diagnosis not present

## 2020-07-19 DIAGNOSIS — M79672 Pain in left foot: Secondary | ICD-10-CM | POA: Diagnosis not present

## 2020-07-26 DIAGNOSIS — J4520 Mild intermittent asthma, uncomplicated: Secondary | ICD-10-CM | POA: Diagnosis not present

## 2020-07-26 DIAGNOSIS — M1990 Unspecified osteoarthritis, unspecified site: Secondary | ICD-10-CM | POA: Diagnosis not present

## 2020-08-28 DIAGNOSIS — J329 Chronic sinusitis, unspecified: Secondary | ICD-10-CM | POA: Diagnosis not present

## 2020-08-28 DIAGNOSIS — M1990 Unspecified osteoarthritis, unspecified site: Secondary | ICD-10-CM | POA: Diagnosis not present

## 2020-09-27 DIAGNOSIS — L110 Acquired keratosis follicularis: Secondary | ICD-10-CM | POA: Diagnosis not present

## 2020-09-27 DIAGNOSIS — I739 Peripheral vascular disease, unspecified: Secondary | ICD-10-CM | POA: Diagnosis not present

## 2020-09-27 DIAGNOSIS — M79672 Pain in left foot: Secondary | ICD-10-CM | POA: Diagnosis not present

## 2020-09-27 DIAGNOSIS — M79671 Pain in right foot: Secondary | ICD-10-CM | POA: Diagnosis not present

## 2020-10-01 DIAGNOSIS — J329 Chronic sinusitis, unspecified: Secondary | ICD-10-CM | POA: Diagnosis not present

## 2020-10-01 DIAGNOSIS — M1990 Unspecified osteoarthritis, unspecified site: Secondary | ICD-10-CM | POA: Diagnosis not present

## 2020-10-01 DIAGNOSIS — J4520 Mild intermittent asthma, uncomplicated: Secondary | ICD-10-CM | POA: Diagnosis not present

## 2020-10-01 DIAGNOSIS — Z0001 Encounter for general adult medical examination with abnormal findings: Secondary | ICD-10-CM | POA: Diagnosis not present

## 2020-10-01 DIAGNOSIS — Z1389 Encounter for screening for other disorder: Secondary | ICD-10-CM | POA: Diagnosis not present

## 2020-10-08 DIAGNOSIS — Z0001 Encounter for general adult medical examination with abnormal findings: Secondary | ICD-10-CM | POA: Diagnosis not present

## 2020-10-08 DIAGNOSIS — Z79899 Other long term (current) drug therapy: Secondary | ICD-10-CM | POA: Diagnosis not present

## 2020-10-29 DIAGNOSIS — M1990 Unspecified osteoarthritis, unspecified site: Secondary | ICD-10-CM | POA: Diagnosis not present

## 2020-10-29 DIAGNOSIS — J4520 Mild intermittent asthma, uncomplicated: Secondary | ICD-10-CM | POA: Diagnosis not present

## 2020-11-29 DIAGNOSIS — M1990 Unspecified osteoarthritis, unspecified site: Secondary | ICD-10-CM | POA: Diagnosis not present

## 2020-11-29 DIAGNOSIS — J4520 Mild intermittent asthma, uncomplicated: Secondary | ICD-10-CM | POA: Diagnosis not present

## 2020-12-28 DIAGNOSIS — L110 Acquired keratosis follicularis: Secondary | ICD-10-CM | POA: Diagnosis not present

## 2020-12-28 DIAGNOSIS — I739 Peripheral vascular disease, unspecified: Secondary | ICD-10-CM | POA: Diagnosis not present

## 2020-12-28 DIAGNOSIS — M79671 Pain in right foot: Secondary | ICD-10-CM | POA: Diagnosis not present

## 2020-12-28 DIAGNOSIS — M79672 Pain in left foot: Secondary | ICD-10-CM | POA: Diagnosis not present

## 2020-12-29 DIAGNOSIS — M1990 Unspecified osteoarthritis, unspecified site: Secondary | ICD-10-CM | POA: Diagnosis not present

## 2020-12-29 DIAGNOSIS — J329 Chronic sinusitis, unspecified: Secondary | ICD-10-CM | POA: Diagnosis not present

## 2021-01-29 DIAGNOSIS — M1990 Unspecified osteoarthritis, unspecified site: Secondary | ICD-10-CM | POA: Diagnosis not present

## 2021-01-29 DIAGNOSIS — D62 Acute posthemorrhagic anemia: Secondary | ICD-10-CM | POA: Diagnosis not present

## 2021-02-12 ENCOUNTER — Encounter: Payer: Self-pay | Admitting: Internal Medicine

## 2021-02-28 DIAGNOSIS — J329 Chronic sinusitis, unspecified: Secondary | ICD-10-CM | POA: Diagnosis not present

## 2021-02-28 DIAGNOSIS — M1990 Unspecified osteoarthritis, unspecified site: Secondary | ICD-10-CM | POA: Diagnosis not present

## 2021-03-14 DIAGNOSIS — M79671 Pain in right foot: Secondary | ICD-10-CM | POA: Diagnosis not present

## 2021-03-14 DIAGNOSIS — M79672 Pain in left foot: Secondary | ICD-10-CM | POA: Diagnosis not present

## 2021-03-14 DIAGNOSIS — L110 Acquired keratosis follicularis: Secondary | ICD-10-CM | POA: Diagnosis not present

## 2021-03-14 DIAGNOSIS — I739 Peripheral vascular disease, unspecified: Secondary | ICD-10-CM | POA: Diagnosis not present

## 2021-04-01 DIAGNOSIS — M1990 Unspecified osteoarthritis, unspecified site: Secondary | ICD-10-CM | POA: Diagnosis not present

## 2021-04-01 DIAGNOSIS — R54 Age-related physical debility: Secondary | ICD-10-CM | POA: Diagnosis not present

## 2021-04-01 DIAGNOSIS — J4520 Mild intermittent asthma, uncomplicated: Secondary | ICD-10-CM | POA: Diagnosis not present

## 2021-04-01 DIAGNOSIS — J309 Allergic rhinitis, unspecified: Secondary | ICD-10-CM | POA: Diagnosis not present

## 2021-04-17 ENCOUNTER — Other Ambulatory Visit (HOSPITAL_COMMUNITY): Payer: Self-pay | Admitting: Internal Medicine

## 2021-04-17 DIAGNOSIS — Z1231 Encounter for screening mammogram for malignant neoplasm of breast: Secondary | ICD-10-CM

## 2021-05-03 DIAGNOSIS — D62 Acute posthemorrhagic anemia: Secondary | ICD-10-CM | POA: Diagnosis not present

## 2021-05-03 DIAGNOSIS — K219 Gastro-esophageal reflux disease without esophagitis: Secondary | ICD-10-CM | POA: Diagnosis not present

## 2021-05-08 ENCOUNTER — Encounter: Payer: Self-pay | Admitting: Gastroenterology

## 2021-05-16 ENCOUNTER — Other Ambulatory Visit: Payer: Self-pay

## 2021-05-16 ENCOUNTER — Ambulatory Visit (HOSPITAL_COMMUNITY)
Admission: RE | Admit: 2021-05-16 | Discharge: 2021-05-16 | Disposition: A | Discharge status: Home / Self Care | Payer: Medicare Other | Source: Ambulatory Visit | Attending: Internal Medicine | Admitting: Internal Medicine

## 2021-05-16 DIAGNOSIS — Z1231 Encounter for screening mammogram for malignant neoplasm of breast: Secondary | ICD-10-CM | POA: Diagnosis not present

## 2021-05-27 ENCOUNTER — Other Ambulatory Visit (HOSPITAL_COMMUNITY): Payer: Self-pay | Admitting: Internal Medicine

## 2021-05-27 DIAGNOSIS — R928 Other abnormal and inconclusive findings on diagnostic imaging of breast: Secondary | ICD-10-CM

## 2021-06-02 DIAGNOSIS — K219 Gastro-esophageal reflux disease without esophagitis: Secondary | ICD-10-CM | POA: Diagnosis not present

## 2021-06-02 DIAGNOSIS — J4520 Mild intermittent asthma, uncomplicated: Secondary | ICD-10-CM | POA: Diagnosis not present

## 2021-06-04 ENCOUNTER — Other Ambulatory Visit: Payer: Self-pay

## 2021-06-04 ENCOUNTER — Ambulatory Visit (HOSPITAL_COMMUNITY)
Admission: RE | Admit: 2021-06-04 | Discharge: 2021-06-04 | Disposition: A | Discharge status: Home / Self Care | Payer: Medicare Other | Source: Ambulatory Visit | Attending: Internal Medicine | Admitting: Internal Medicine

## 2021-06-04 DIAGNOSIS — R928 Other abnormal and inconclusive findings on diagnostic imaging of breast: Secondary | ICD-10-CM | POA: Diagnosis not present

## 2021-06-04 DIAGNOSIS — R922 Inconclusive mammogram: Secondary | ICD-10-CM | POA: Diagnosis not present

## 2021-06-06 DIAGNOSIS — I739 Peripheral vascular disease, unspecified: Secondary | ICD-10-CM | POA: Diagnosis not present

## 2021-06-06 DIAGNOSIS — M79671 Pain in right foot: Secondary | ICD-10-CM | POA: Diagnosis not present

## 2021-06-06 DIAGNOSIS — M79672 Pain in left foot: Secondary | ICD-10-CM | POA: Diagnosis not present

## 2021-06-06 DIAGNOSIS — L110 Acquired keratosis follicularis: Secondary | ICD-10-CM | POA: Diagnosis not present

## 2021-06-13 ENCOUNTER — Ambulatory Visit: Payer: Medicare Other | Admitting: Gastroenterology

## 2021-07-03 DIAGNOSIS — K219 Gastro-esophageal reflux disease without esophagitis: Secondary | ICD-10-CM | POA: Diagnosis not present

## 2021-07-03 DIAGNOSIS — J329 Chronic sinusitis, unspecified: Secondary | ICD-10-CM | POA: Diagnosis not present

## 2021-08-02 DIAGNOSIS — K219 Gastro-esophageal reflux disease without esophagitis: Secondary | ICD-10-CM | POA: Diagnosis not present

## 2021-08-02 DIAGNOSIS — J329 Chronic sinusitis, unspecified: Secondary | ICD-10-CM | POA: Diagnosis not present

## 2021-08-02 DIAGNOSIS — M1990 Unspecified osteoarthritis, unspecified site: Secondary | ICD-10-CM | POA: Diagnosis not present

## 2021-09-02 DIAGNOSIS — J329 Chronic sinusitis, unspecified: Secondary | ICD-10-CM | POA: Diagnosis not present

## 2021-09-02 DIAGNOSIS — M1990 Unspecified osteoarthritis, unspecified site: Secondary | ICD-10-CM | POA: Diagnosis not present

## 2021-09-10 DIAGNOSIS — I739 Peripheral vascular disease, unspecified: Secondary | ICD-10-CM | POA: Diagnosis not present

## 2021-09-10 DIAGNOSIS — L110 Acquired keratosis follicularis: Secondary | ICD-10-CM | POA: Diagnosis not present

## 2021-09-10 DIAGNOSIS — M79671 Pain in right foot: Secondary | ICD-10-CM | POA: Diagnosis not present

## 2021-09-10 DIAGNOSIS — M79672 Pain in left foot: Secondary | ICD-10-CM | POA: Diagnosis not present

## 2021-09-24 ENCOUNTER — Other Ambulatory Visit: Payer: Self-pay

## 2021-09-24 ENCOUNTER — Encounter: Payer: Self-pay | Admitting: Gastroenterology

## 2021-09-24 ENCOUNTER — Ambulatory Visit (INDEPENDENT_AMBULATORY_CARE_PROVIDER_SITE_OTHER): Payer: Medicare Other | Admitting: Gastroenterology

## 2021-09-24 VITALS — BP 137/85 | HR 86 | Temp 96.8°F | Ht 65.0 in | Wt 130.8 lb

## 2021-09-24 DIAGNOSIS — K279 Peptic ulcer, site unspecified, unspecified as acute or chronic, without hemorrhage or perforation: Secondary | ICD-10-CM | POA: Diagnosis not present

## 2021-09-24 DIAGNOSIS — K5900 Constipation, unspecified: Secondary | ICD-10-CM | POA: Diagnosis not present

## 2021-09-24 NOTE — Progress Notes (Signed)
Primary Care Physician:  Carrolyn Meiers, MD Primary Gastroenterologist:  Dr. Abbey Chatters  Chief Complaint  Patient presents with   Peptic Ulcer Disease    Doing ok   Constipation    Med helps    HPI:   Emily Nichols is an 81 y.o. female presenting today in follow-up with history of GI bleed due to PUD in 2019 due to NSAIDs, subsequent  EGD with documented healing, adenomas in 2019 but without need for further surveillance due to age. Last seen in July 2021.   She is not on a PPI, as discussed at last visit. PUD was felt due to NSAIDs. She has been doing well. Continues to abstain from all NSAIDs. Only takes Tylenol. No dysphagia. No abdominal pain. No N/V. No overt GI bleeding. Has a great appetite.   Constipation: rare. Will take a stool softener as needed. She is unable to quantify how often, but she feels this is very rare. No rectal bleeding.   She has no GI complaints today.    Past Medical History:  Diagnosis Date   Arthritis    Colon polyps    COPD (chronic obstructive pulmonary disease) (HCC)    Seasonal allergies     Past Surgical History:  Procedure Laterality Date   BIOPSY  08/13/2018   Procedure: BIOPSY;  Surgeon: Danie Binder, MD;  [facility];  Service: Endoscopy;;  Gastric   COLONOSCOPY N/A 04/07/2014   Procedure: COLONOSCOPY;  Surgeon: Danie Binder, MD;  [facility]: [facility];  Service: Endoscopy;  Laterality: N/A;  8:30 AM   COLONOSCOPY     COLONOSCOPY N/A 09/18/2017   two 3 to 4 mm polyps of the hepatic flexure, two 3 to 5 mm polyps in the sigmoid colon and splenic flexure, one 6 mm polyp at the splenic flexure as well as a single nonbleeding colonic angiectasia.  Surgical pathology found the polyps to be a mix of tubular adenoma and hyperplastic polyps.  Recommended no further screening colonoscopy in the future given her age.   ESOPHAGOGASTRODUODENOSCOPY N/A 03/10/2018   03/10/2018 which found normal esophagus, 2 cm hiatal  hernia, nonbleeding gastric ulcer with pigmented material, gastritis   ESOPHAGOGASTRODUODENOSCOPY N/A 08/13/2018   benign appearing esophageal stricture, small hiatal hernia, mild gastritis status post biopsy found to be mild chronic gastritis.    Current Outpatient Medications  Medication Sig Dispense Refill   ADVAIR DISKUS 250-50 MCG/DOSE AEPB INHALE ONE PUFF BY MOUTH TWICE DAILY  3   Calcium Carb-Cholecalciferol (CALCIUM 600+D3 PO) Take 1 tablet by mouth daily.     docusate sodium (COLACE) 100 MG capsule Take 100 mg by mouth 2 (two) times daily as needed for mild constipation.     fluticasone (FLONASE) 50 MCG/ACT nasal spray Place 2 sprays into both nostrils daily as needed for allergies.      hydrochlorothiazide (MICROZIDE) 12.5 MG capsule Take 12.5 mg by mouth daily as needed (foot swelling.).   3   loratadine (CLARITIN) 10 MG tablet Take 10 mg by mouth daily as needed for allergies.      montelukast (SINGULAIR) 10 MG tablet Take 10 mg by mouth daily.      pantoprazole (PROTONIX) 40 MG tablet Take 1 tablet (40 mg total) by mouth daily. 90 tablet 3   No current facility-administered medications for this visit.    Allergies as of 09/24/2021   (No Known Allergies)    Family History  Problem Relation Age of Onset  Colon cancer Neg Hx    Colon polyps Neg Hx     Social History   Socioeconomic History   Marital status: Single    Spouse name: Not on file   Number of children: Not on file   Years of education: Not on file   Highest education level: Not on file  Occupational History   Not on file  Tobacco Use   Smoking status: Some Days    Years: 10.00    Types: Cigarettes   Smokeless tobacco: Never   Tobacco comments:    "Sometimes I just light up one and smoke it."  Vaping Use   Vaping Use: Never used  Substance and Sexual Activity   Alcohol use: No   Drug use: No   Sexual activity: Not on file  Other Topics Concern   Not on file  Social History Narrative   Not on  file   Social Determinants of Health   Financial Resource Strain: Not on file  Food Insecurity: Not on file  Transportation Needs: Not on file  Physical Activity: Not on file  Stress: Not on file  Social Connections: Not on file  Intimate Partner Violence: Not on file    Review of Systems: Gen: Denies any fever, chills, fatigue, weight loss, lack of appetite.  CV: Denies chest pain, heart palpitations, peripheral edema, syncope.  Resp: Denies shortness of breath at rest or with exertion. Denies wheezing or cough.  GI: see HPI GU : Denies urinary burning, urinary frequency, urinary hesitancy MS: Denies joint pain, muscle weakness, cramps, or limitation of movement.  Derm: Denies rash, itching, dry skin Psych: Denies depression, anxiety, memory loss, and confusion Heme: Denies bruising, bleeding, and enlarged lymph nodes.  Physical Exam: BP 137/85    Pulse 86    Temp (!) 96.8 F (36 C) (Temporal)    Ht 5\' 5"  (1.651 m)    Wt 130 lb 12.8 oz (59.3 kg)    BMI 21.77 kg/m  General:   Alert and oriented. Pleasant and cooperative. Well-nourished and well-developed.  Head:  Normocephalic and atraumatic. Eyes:  Without icterus, sclera clear and conjunctiva pink.  Ears:  mildly hard of hearing Mouth:  mask in place Abdomen:  +BS, soft, non-tender and non-distended. No HSM noted. No guarding or rebound. No masses appreciated.  Rectal:  Deferred  Msk:  Symmetrical without gross deformities. Normal posture. Extremities:  Without edema. Neurologic:  Alert and  oriented x4;  grossly normal neurologically. Skin:  Intact without significant lesions or rashes. Psych:  Alert and cooperative. Normal mood and affect.  ASSESSMENT/PLAN: Emily Nichols is an 81 y.o. female presenting today in follow-up with history of GI bleed due to PUD in 2019 due to NSAIDs, subsequent  EGD with documented healing, adenomas in 2019 but without need for further surveillance due to age. Last seen in July 2021.     She is doing well from a GI standpoint. As PUD was felt related to NSAIDs, and she is no longer on any NSAID therapy, the decision was made several years ago to come off PPI. She continues to avoid all forms of NSAIDs and only uses Tylenol products. She is well aware to call us if for some reason she is prescribed one of these agents.  Constipation well managed with stool softener as needed, which is rare.   As she is doing well and not on any prescriptive therapies from [place], I recommend returning prn. She is well aware of signs/symptoms that would  prompt attention.  Return prn. No repeat colonoscopy due to age unless clinically necessary.   Annitta Needs, PhD, ANP-BC [facility]

## 2021-09-24 NOTE — Patient Instructions (Signed)
We will see you back as needed!  Please call us if any problems swallowing, abdominal pain, nausea, vomiting, blood in stools, or weight loss.   It was good to meet you!  It was a pleasure to see you today. I want to create trusting relationships with patients to provide genuine, compassionate, and quality care. I value your feedback. If you receive a survey regarding your visit,  I greatly appreciate you taking time to fill this out.   Annitta Needs, PhD, ANP-BC [facility]

## 2021-10-03 DIAGNOSIS — M130 Polyarthritis, unspecified: Secondary | ICD-10-CM | POA: Diagnosis not present

## 2021-10-03 DIAGNOSIS — Z0001 Encounter for general adult medical examination with abnormal findings: Secondary | ICD-10-CM | POA: Diagnosis not present

## 2021-10-03 DIAGNOSIS — K219 Gastro-esophageal reflux disease without esophagitis: Secondary | ICD-10-CM | POA: Diagnosis not present

## 2021-10-03 DIAGNOSIS — J4520 Mild intermittent asthma, uncomplicated: Secondary | ICD-10-CM | POA: Diagnosis not present

## 2021-10-03 DIAGNOSIS — Z1389 Encounter for screening for other disorder: Secondary | ICD-10-CM | POA: Diagnosis not present

## 2021-10-31 DIAGNOSIS — J329 Chronic sinusitis, unspecified: Secondary | ICD-10-CM | POA: Diagnosis not present

## 2021-10-31 DIAGNOSIS — M1990 Unspecified osteoarthritis, unspecified site: Secondary | ICD-10-CM | POA: Diagnosis not present

## 2021-11-19 DIAGNOSIS — I10 Essential (primary) hypertension: Secondary | ICD-10-CM | POA: Diagnosis not present

## 2021-11-19 DIAGNOSIS — M79672 Pain in left foot: Secondary | ICD-10-CM | POA: Diagnosis not present

## 2021-11-19 DIAGNOSIS — K219 Gastro-esophageal reflux disease without esophagitis: Secondary | ICD-10-CM | POA: Diagnosis not present

## 2021-11-19 DIAGNOSIS — L110 Acquired keratosis follicularis: Secondary | ICD-10-CM | POA: Diagnosis not present

## 2021-11-19 DIAGNOSIS — I739 Peripheral vascular disease, unspecified: Secondary | ICD-10-CM | POA: Diagnosis not present

## 2021-11-19 DIAGNOSIS — D62 Acute posthemorrhagic anemia: Secondary | ICD-10-CM | POA: Diagnosis not present

## 2021-11-19 DIAGNOSIS — M79671 Pain in right foot: Secondary | ICD-10-CM | POA: Diagnosis not present

## 2021-11-19 DIAGNOSIS — Z0001 Encounter for general adult medical examination with abnormal findings: Secondary | ICD-10-CM | POA: Diagnosis not present

## 2021-12-01 DIAGNOSIS — M1990 Unspecified osteoarthritis, unspecified site: Secondary | ICD-10-CM | POA: Diagnosis not present

## 2021-12-01 DIAGNOSIS — J4520 Mild intermittent asthma, uncomplicated: Secondary | ICD-10-CM | POA: Diagnosis not present

## 2021-12-31 DIAGNOSIS — M1990 Unspecified osteoarthritis, unspecified site: Secondary | ICD-10-CM | POA: Diagnosis not present

## 2021-12-31 DIAGNOSIS — K219 Gastro-esophageal reflux disease without esophagitis: Secondary | ICD-10-CM | POA: Diagnosis not present

## 2022-01-31 DIAGNOSIS — J4520 Mild intermittent asthma, uncomplicated: Secondary | ICD-10-CM | POA: Diagnosis not present

## 2022-01-31 DIAGNOSIS — K219 Gastro-esophageal reflux disease without esophagitis: Secondary | ICD-10-CM | POA: Diagnosis not present

## 2022-02-11 DIAGNOSIS — M79672 Pain in left foot: Secondary | ICD-10-CM | POA: Diagnosis not present

## 2022-02-11 DIAGNOSIS — I739 Peripheral vascular disease, unspecified: Secondary | ICD-10-CM | POA: Diagnosis not present

## 2022-02-11 DIAGNOSIS — M79671 Pain in right foot: Secondary | ICD-10-CM | POA: Diagnosis not present

## 2022-02-11 DIAGNOSIS — L110 Acquired keratosis follicularis: Secondary | ICD-10-CM | POA: Diagnosis not present

## 2022-03-02 DIAGNOSIS — J4520 Mild intermittent asthma, uncomplicated: Secondary | ICD-10-CM | POA: Diagnosis not present

## 2022-03-02 DIAGNOSIS — K219 Gastro-esophageal reflux disease without esophagitis: Secondary | ICD-10-CM | POA: Diagnosis not present

## 2022-03-20 DIAGNOSIS — J4520 Mild intermittent asthma, uncomplicated: Secondary | ICD-10-CM | POA: Diagnosis not present

## 2022-03-20 DIAGNOSIS — J309 Allergic rhinitis, unspecified: Secondary | ICD-10-CM | POA: Diagnosis not present

## 2022-03-20 DIAGNOSIS — M1990 Unspecified osteoarthritis, unspecified site: Secondary | ICD-10-CM | POA: Diagnosis not present

## 2022-03-20 DIAGNOSIS — K219 Gastro-esophageal reflux disease without esophagitis: Secondary | ICD-10-CM | POA: Diagnosis not present

## 2022-04-20 DIAGNOSIS — D649 Anemia, unspecified: Secondary | ICD-10-CM | POA: Diagnosis not present

## 2022-04-20 DIAGNOSIS — K2100 Gastro-esophageal reflux disease with esophagitis, without bleeding: Secondary | ICD-10-CM | POA: Diagnosis not present

## 2022-04-22 DIAGNOSIS — M79671 Pain in right foot: Secondary | ICD-10-CM | POA: Diagnosis not present

## 2022-04-22 DIAGNOSIS — L110 Acquired keratosis follicularis: Secondary | ICD-10-CM | POA: Diagnosis not present

## 2022-04-22 DIAGNOSIS — M79672 Pain in left foot: Secondary | ICD-10-CM | POA: Diagnosis not present

## 2022-04-22 DIAGNOSIS — I739 Peripheral vascular disease, unspecified: Secondary | ICD-10-CM | POA: Diagnosis not present

## 2022-05-20 DIAGNOSIS — J329 Chronic sinusitis, unspecified: Secondary | ICD-10-CM | POA: Diagnosis not present

## 2022-05-20 DIAGNOSIS — K219 Gastro-esophageal reflux disease without esophagitis: Secondary | ICD-10-CM | POA: Diagnosis not present

## 2022-06-20 DIAGNOSIS — J329 Chronic sinusitis, unspecified: Secondary | ICD-10-CM | POA: Diagnosis not present

## 2022-06-20 DIAGNOSIS — K219 Gastro-esophageal reflux disease without esophagitis: Secondary | ICD-10-CM | POA: Diagnosis not present

## 2022-07-01 DIAGNOSIS — M79672 Pain in left foot: Secondary | ICD-10-CM | POA: Diagnosis not present

## 2022-07-01 DIAGNOSIS — M79671 Pain in right foot: Secondary | ICD-10-CM | POA: Diagnosis not present

## 2022-07-01 DIAGNOSIS — L110 Acquired keratosis follicularis: Secondary | ICD-10-CM | POA: Diagnosis not present

## 2022-07-01 DIAGNOSIS — I739 Peripheral vascular disease, unspecified: Secondary | ICD-10-CM | POA: Diagnosis not present

## 2022-07-20 DIAGNOSIS — K219 Gastro-esophageal reflux disease without esophagitis: Secondary | ICD-10-CM | POA: Diagnosis not present

## 2022-07-20 DIAGNOSIS — J4520 Mild intermittent asthma, uncomplicated: Secondary | ICD-10-CM | POA: Diagnosis not present

## 2023-04-24 DIAGNOSIS — J4520 Mild intermittent asthma, uncomplicated: Secondary | ICD-10-CM | POA: Diagnosis not present

## 2023-04-24 DIAGNOSIS — K219 Gastro-esophageal reflux disease without esophagitis: Secondary | ICD-10-CM | POA: Diagnosis not present

## 2023-05-24 DIAGNOSIS — K219 Gastro-esophageal reflux disease without esophagitis: Secondary | ICD-10-CM | POA: Diagnosis not present

## 2023-05-24 DIAGNOSIS — J4520 Mild intermittent asthma, uncomplicated: Secondary | ICD-10-CM | POA: Diagnosis not present

## 2023-05-25 NOTE — [facility] Nurse Program (Signed)
Encountered client At [employer] during [place] [employer] applications and she requested a blood pressure check.  Her provider is Dr. Felecia Shelling and she has taken her prescribed medications today.  Her blood pressure right arm today sitting 111/70 pulse 84  Encouraged her to continue follow ups as recommended by her medical provider and to call and consult with provider for any health concerns or questions regarding her medications. She states understanding.  Encouraged to continue to follow plan of care as developed by her and her primary care provider for health and medication management.   Francee Nodal RN [facility] [employer]

## 2023-06-01 DIAGNOSIS — I739 Peripheral vascular disease, unspecified: Secondary | ICD-10-CM | POA: Diagnosis not present

## 2023-06-01 DIAGNOSIS — M79671 Pain in right foot: Secondary | ICD-10-CM | POA: Diagnosis not present

## 2023-06-01 DIAGNOSIS — L110 Acquired keratosis follicularis: Secondary | ICD-10-CM | POA: Diagnosis not present

## 2023-06-01 DIAGNOSIS — M79672 Pain in left foot: Secondary | ICD-10-CM | POA: Diagnosis not present

## 2023-06-24 DIAGNOSIS — K219 Gastro-esophageal reflux disease without esophagitis: Secondary | ICD-10-CM | POA: Diagnosis not present

## 2023-06-24 DIAGNOSIS — J4520 Mild intermittent asthma, uncomplicated: Secondary | ICD-10-CM | POA: Diagnosis not present

## 2023-07-13 DIAGNOSIS — H04123 Dry eye syndrome of bilateral lacrimal glands: Secondary | ICD-10-CM | POA: Diagnosis not present

## 2023-07-24 DIAGNOSIS — K219 Gastro-esophageal reflux disease without esophagitis: Secondary | ICD-10-CM | POA: Diagnosis not present

## 2023-07-24 DIAGNOSIS — J4520 Mild intermittent asthma, uncomplicated: Secondary | ICD-10-CM | POA: Diagnosis not present

## 2024-04-15 ENCOUNTER — Other Ambulatory Visit (HOSPITAL_COMMUNITY): Payer: Self-pay | Admitting: Gerontology

## 2024-04-15 ENCOUNTER — Ambulatory Visit (HOSPITAL_COMMUNITY)
Admission: RE | Admit: 2024-04-15 | Discharge: 2024-04-15 | Disposition: A | Discharge status: Home / Self Care | Source: Ambulatory Visit | Attending: Gerontology | Admitting: Gerontology

## 2024-04-15 DIAGNOSIS — W19XXXA Unspecified fall, initial encounter: Secondary | ICD-10-CM | POA: Diagnosis not present

## 2024-04-15 DIAGNOSIS — S42023A Displaced fracture of shaft of unspecified clavicle, initial encounter for closed fracture: Secondary | ICD-10-CM | POA: Diagnosis present

## 2024-04-15 DIAGNOSIS — S42033A Displaced fracture of lateral end of unspecified clavicle, initial encounter for closed fracture: Secondary | ICD-10-CM | POA: Diagnosis not present

## 2024-04-26 ENCOUNTER — Encounter: Payer: Self-pay | Admitting: Orthopedic Surgery

## 2024-04-26 ENCOUNTER — Other Ambulatory Visit (INDEPENDENT_AMBULATORY_CARE_PROVIDER_SITE_OTHER): Payer: Self-pay

## 2024-04-26 ENCOUNTER — Ambulatory Visit (INDEPENDENT_AMBULATORY_CARE_PROVIDER_SITE_OTHER): Admitting: Orthopedic Surgery

## 2024-04-26 VITALS — BP 111/70 | Ht 65.0 in | Wt 129.0 lb

## 2024-04-26 DIAGNOSIS — M25512 Pain in left shoulder: Secondary | ICD-10-CM

## 2024-04-26 DIAGNOSIS — S91301A Unspecified open wound, right foot, initial encounter: Secondary | ICD-10-CM

## 2024-04-26 DIAGNOSIS — S42022A Displaced fracture of shaft of left clavicle, initial encounter for closed fracture: Secondary | ICD-10-CM

## 2024-04-26 NOTE — Patient Instructions (Signed)
 Keep the wound on your right foot clean  Recommend you go to the pharmacy and get some Polysporin or triple antibiotic ointment.  Put a little of this on the wound and cover it with a Band-Aid   Medications as needed for your left shoulder  Limit activities.  Follow-up in 2 weeks

## 2024-04-26 NOTE — Progress Notes (Signed)
 New Patient Visit  Assessment: Emily Nichols is a 83 y.o. female with the following: 1. Closed displaced fracture of shaft of left clavicle, initial encounter 2. Open wound of right foot, initial encounter  Plan: Emily Nichols fell approximately 2 weeks ago, sustained a minimally displaced distal clavicle fracture on the left.  Repeat radiographs demonstrates stable overall alignment.  Minimal pain.  I do not think that she needs a sling, based on her current mobility and function.  She needs to be careful, so it does not get worse.  Limit lifting.  She also sustained a open wound to the dorsal aspect of the right foot.  It is currently healthy appearing without purulence or surrounding redness.  I have advised her to keep this clean.  She can treated with triple antibiotic ointment and a Band-Aid.  I would like see her back in 2 weeks for repeat evaluation.  Follow-up: Return in about 2 weeks (around 05/10/2024).  Subjective:  Chief Complaint  Patient presents with   Clavicle Injury    History of Present Illness: Emily Nichols is a 83 y.o. female who presents for evaluation of left shoulder pain.  She fell approximately 2 weeks ago.  She sustained a clavicle injury, which was evaluated with x-ray at [facility].  She was not given a sling.  She is taking medicines as needed.  She also notes that she had a cut on the dorsal aspect of the right foot.  She did not notice this initially.  It is causing her some discomfort.  She is asking what she can put on the wound.   Review of Systems: No fevers or chills No numbness or tingling No chest pain No shortness of breath No bowel or bladder dysfunction No GI distress No headaches   Medical History:  Past Medical History:  Diagnosis Date   Arthritis    Colon polyps    COPD (chronic obstructive pulmonary disease) (HCC)    Seasonal allergies     Past Surgical History:  Procedure Laterality Date   BIOPSY  08/13/2018    Procedure: BIOPSY;  Surgeon: Harvey Margo CROME, MD;  [facility];  Service: Endoscopy;;  Gastric   COLONOSCOPY N/A 04/07/2014   Procedure: COLONOSCOPY;  Surgeon: Margo CROME Harvey, MD;  [facility];  Service: Endoscopy;  Laterality: N/A;  8:30 AM   COLONOSCOPY     COLONOSCOPY N/A 09/18/2017   two 3 to 4 mm polyps of the hepatic flexure, two 3 to 5 mm polyps in the sigmoid colon and splenic flexure, one 6 mm polyp at the splenic flexure as well as a single nonbleeding colonic angiectasia.  Surgical pathology found the polyps to be a mix of tubular adenoma and hyperplastic polyps.  Recommended no further screening colonoscopy in the future given her age.   ESOPHAGOGASTRODUODENOSCOPY N/A 03/10/2018   03/10/2018 which found normal esophagus, 2 cm hiatal hernia, nonbleeding gastric ulcer with pigmented material, gastritis   ESOPHAGOGASTRODUODENOSCOPY N/A 08/13/2018   benign appearing esophageal stricture, small hiatal hernia, mild gastritis status post biopsy found to be mild chronic gastritis.    Family History  Problem Relation Age of Onset   Colon cancer Neg Hx    Colon polyps Neg Hx    Social History   Tobacco Use   Smoking status: Some Days    Types: Cigarettes   Smokeless tobacco: Never   Tobacco comments:    Sometimes I just light up one and smoke it.  Vaping Use   Vaping status: Never Used  Substance Use Topics   Alcohol use: No   Drug use: No    No Known Allergies  No outpatient medications have been marked as taking for the 04/26/24 encounter (Office Visit) with Onesimo Oneil LABOR, MD.    Objective: BP 111/70 Comment: 05/24/24  Ht 5' 5 (1.651 m)   Wt 129 lb (58.5 kg)   BMI 21.47 kg/m   Physical Exam:  General: Elderly female., Alert and oriented., and No acute distress. Gait: Slow, steady gait.  Left shoulder mild deformity overlying the clavicle.  Mild tenderness in this area.  Slightly restricted forward flexion.  Fingers are warm and  well-perfused.  She does have deformities of both hands consistent with rheumatoid arthritis.  Sensation is intact distally.  Small wound on the dorsal aspect of the right foot.  This is clean, dry and intact.  Mild swelling in the area.  No surrounding erythema or drainage.  Minimal tenderness.  IMAGING: I personally ordered and reviewed the following images   X-rays of the left clavicle were obtained in clinic today.  Distal clavicle fracture remains minimally displaced.  Compared to prior x-rays, there is been no change in overall alignment.  AC joint is intact.  No bony lesions.  No dislocation.  Impression: Minimally displaced distal left clavicle fracture   New Medications:  No orders of the defined types were placed in this encounter.     Oneil LABOR Onesimo, MD  04/26/2024 11:24 AM

## 2024-05-10 ENCOUNTER — Encounter: Payer: Self-pay | Admitting: Orthopedic Surgery

## 2024-05-10 ENCOUNTER — Ambulatory Visit: Admitting: Orthopedic Surgery

## 2024-05-10 ENCOUNTER — Other Ambulatory Visit (INDEPENDENT_AMBULATORY_CARE_PROVIDER_SITE_OTHER): Payer: Self-pay

## 2024-05-10 DIAGNOSIS — S42022D Displaced fracture of shaft of left clavicle, subsequent encounter for fracture with routine healing: Secondary | ICD-10-CM | POA: Diagnosis not present

## 2024-05-10 DIAGNOSIS — S42022A Displaced fracture of shaft of left clavicle, initial encounter for closed fracture: Secondary | ICD-10-CM

## 2024-05-10 NOTE — Progress Notes (Signed)
 Return patient Visit  Assessment: Emily Nichols is a 83 y.o. female with the following: 1. Closed displaced fracture of shaft of left clavicle, initial encounter 2. Open wound of right foot, initial encounter  Plan: Emily Nichols has a minimally displaced fracture of the left distal clavicle.  Radiographs remain stable.  Her pain is improving.  Her motion is getting better.  She also has a lesion on the dorsal aspect of the right foot, which appears stable.  Continue to keep this wound clean, and apply bacitracin or similar ointment and cover as needed.  Follow-up in approximately 3 weeks.   Follow-up: Return in about 3 weeks (around 05/31/2024).  Subjective:  Chief Complaint  Patient presents with   Fracture    L Clavicle DOI approx 04/12/24    History of Present Illness: Emily Nichols is a 83 y.o. female who returns for evaluation of left shoulder pain.  She sustained a left clavicle fracture approximately 1 month ago.  I saw her in clinic 2 weeks ago.  She has been doing well.  Pain is getting better.  No numbness or tingling.  Her motion is getting better.  She continues to keep her right foot clean and has been using an ointment.  Review of Systems: No fevers or chills No numbness or tingling No chest pain No shortness of breath No bowel or bladder dysfunction No GI distress No headaches   Objective: There were no vitals taken for this visit.  Physical Exam:  General: Elderly female., Alert and oriented., and No acute distress. Gait: Slow, steady gait.  Mild deformity overlying the distal clavicle.  Minimal tenderness in this area.  Forward flexion beyond 100 degrees with some discomfort.  Fingers warm and well-perfused.  Ulnar deviation of her fingers.  No bruising.  No redness.  Wound to the dorsal aspect of the forefoot is clean, dry and intact.  No drainage.  Mild tenderness.  IMAGING: I personally ordered and reviewed the following images   X-rays of  the left clavicle were obtained in clinic today.  These are compared to available x-rays.  No change in overall alignment.  Mild superior displacement.  No obvious callus formation.  No additional injuries.  No bony lesions.  Impression: Stable left distal clavicle fracture   New Medications:  No orders of the defined types were placed in this encounter.     Oneil DELENA Horde, MD  05/10/2024 1:51 PM

## 2024-06-01 ENCOUNTER — Other Ambulatory Visit (INDEPENDENT_AMBULATORY_CARE_PROVIDER_SITE_OTHER): Payer: Self-pay

## 2024-06-01 ENCOUNTER — Ambulatory Visit: Admitting: Orthopedic Surgery

## 2024-06-01 ENCOUNTER — Encounter: Payer: Self-pay | Admitting: Orthopedic Surgery

## 2024-06-01 VITALS — BP 121/73 | HR 68 | Ht 65.0 in | Wt 127.2 lb

## 2024-06-01 DIAGNOSIS — S42022A Displaced fracture of shaft of left clavicle, initial encounter for closed fracture: Secondary | ICD-10-CM

## 2024-06-01 DIAGNOSIS — S42022D Displaced fracture of shaft of left clavicle, subsequent encounter for fracture with routine healing: Secondary | ICD-10-CM

## 2024-06-01 NOTE — Progress Notes (Signed)
 Return patient Visit  Assessment: Emily Nichols is a 83 y.o. female with the following: 1. Closed displaced fracture of shaft of left clavicle, subsequent encounter 2. Open wound of right foot, subsequent encounter  Plan: Emily Nichols has a minimally displaced fracture of the left distal clavicle.  Radiographs have remained stable.  She has near full range of motion.  Strength testing was deferred, but stressed the importance of being very careful and increasing her activities.  No restrictions at this time.  Medicines as needed.  Continue to keep the wound on the right foot clean.  She has any further issues, she will return to clinic.   Follow-up: Return if symptoms worsen or fail to improve.  Subjective:  Chief Complaint  Patient presents with   Shoulder Pain    L clavicle/ doing very good.    History of Present Illness: Emily Nichols is a 83 y.o. female who returns for evaluation of left shoulder pain.  She fell and sustained an injury to the left clavicle approximately 7 weeks ago.  She is doing well.  She is not having any pain.  She has good overhead motion.  She notes some strength limitations.  She is not concerned about the wound on the right foot.   Review of Systems: No fevers or chills No numbness or tingling No chest pain No shortness of breath No bowel or bladder dysfunction No GI distress No headaches   Objective: BP 121/73   Pulse 68   Ht 5' 5 (1.651 m)   Wt 127 lb 4 oz (57.7 kg)   BMI 21.18 kg/m   Physical Exam:  General: Elderly female., Alert and oriented., and No acute distress. Gait: Slow, steady gait.  Left shoulder with mild swelling.  Mild deformity over the distal clavicle.  She has 160 degrees of forward flexion.  Fingers are warm and well-perfused.  Deformity of the left hand consistent with rheumatoid arthritis.  Wound on the dorsal aspect of the right foot is almost completely healed.  No drainage.  No surrounding  erythema.  IMAGING: I personally ordered and reviewed the following images   X-rays left clavicle were obtained in clinic today.  These are compared to prior x-rays.  Minimally displaced distal clavicle fracture.  No change in alignment.  No dislocation.  No new injuries.  No bony lesions.  Impression: Healed left distal clavicle shaft fracture  New Medications:  No orders of the defined types were placed in this encounter.     Oneil DELENA Horde, MD  06/01/2024 11:23 AM
